# Patient Record
Sex: Female | Born: 1990 | Race: White | Hispanic: Yes | Marital: Married | Smoking: Never smoker
Health system: Southern US, Community
[De-identification: ages and names within clinical notes are randomized; demographics above are authoritative.]

## PROBLEM LIST (undated history)

## (undated) DIAGNOSIS — K219 Gastro-esophageal reflux disease without esophagitis: Secondary | ICD-10-CM

## (undated) DIAGNOSIS — E119 Type 2 diabetes mellitus without complications: Secondary | ICD-10-CM

---

## 2020-09-18 ENCOUNTER — Other Ambulatory Visit: Payer: Self-pay

## 2020-09-18 ENCOUNTER — Ambulatory Visit: Payer: Medicaid Other | Admitting: Gerontology

## 2020-09-18 ENCOUNTER — Encounter: Payer: Self-pay | Admitting: Gerontology

## 2020-09-18 VITALS — BP 121/79 | HR 81 | Ht 64.0 in | Wt 203.9 lb

## 2020-09-18 DIAGNOSIS — Z794 Long term (current) use of insulin: Secondary | ICD-10-CM

## 2020-09-18 DIAGNOSIS — E119 Type 2 diabetes mellitus without complications: Secondary | ICD-10-CM

## 2020-09-18 DIAGNOSIS — D75839 Thrombocytosis, unspecified: Secondary | ICD-10-CM

## 2020-09-18 DIAGNOSIS — Z7689 Persons encountering health services in other specified circumstances: Secondary | ICD-10-CM

## 2020-09-18 DIAGNOSIS — E785 Hyperlipidemia, unspecified: Secondary | ICD-10-CM | POA: Insufficient documentation

## 2020-09-18 DIAGNOSIS — E1169 Type 2 diabetes mellitus with other specified complication: Secondary | ICD-10-CM | POA: Insufficient documentation

## 2020-09-18 MED ORDER — INSULIN LISPRO 100 UNIT/ML ~~LOC~~ SOLN: 12.0000 [IU] | Freq: Three times a day (TID) | SUBCUTANEOUS | 1 refills | Status: DC

## 2020-09-18 MED ORDER — INSULIN GLARGINE 100 UNIT/ML ~~LOC~~ SOLN: 35.0000 [IU] | Freq: Every day | SUBCUTANEOUS | 1 refills | Status: AC

## 2020-09-18 MED ORDER — METFORMIN HCL 1000 MG PO TABS: 1000.0000 mg | ORAL_TABLET | Freq: Two times a day (BID) | ORAL | 1 refills | Status: DC

## 2020-09-18 MED ORDER — LISINOPRIL 2.5 MG PO TABS: 2.5000 mg | ORAL_TABLET | Freq: Every day | ORAL | 1 refills | Status: AC

## 2020-09-18 NOTE — Patient Instructions (Signed)
High Cholesterol  High cholesterol is a condition in which the blood has high levels of a white, waxy, fat-like substance (cholesterol). The human body needs small amounts of cholesterol. The liver makes all the cholesterol that the body needs. Extra (excess) cholesterol comes from the food that we eat. Cholesterol is carried from the liver by the blood through the blood vessels. If you have high cholesterol, deposits (plaques) may build up on the walls of your blood vessels (arteries). Plaques make the arteries narrower and stiffer. Cholesterol plaques increase your risk for heart attack and stroke. Work with your health care provider to keep your cholesterol levels in a healthy range. What increases the risk? This condition is more likely to develop in people who:  Eat foods that are high in animal fat (saturated fat) or cholesterol.  Are overweight.  Are not getting enough exercise.  Have a family history of high cholesterol. What are the signs or symptoms? There are no symptoms of this condition. How is this diagnosed? This condition may be diagnosed from the results of a blood test.  If you are older than age 20, your health care provider may check your cholesterol every 4-6 years.  You may be checked more often if you already have high cholesterol or other risk factors for heart disease. The blood test for cholesterol measures:  "Bad" cholesterol (LDL cholesterol). This is the main type of cholesterol that causes heart disease. The desired level for LDL is less than 100.  "Good" cholesterol (HDL cholesterol). This type helps to protect against heart disease by cleaning the arteries and carrying the LDL away. The desired level for HDL is 60 or higher.  Triglycerides. These are fats that the body can store or burn for energy. The desired number for triglycerides is lower than 150.  Total cholesterol. This is a measure of the total amount of cholesterol in your blood, including LDL  cholesterol, HDL cholesterol, and triglycerides. A healthy number is less than 200. How is this treated? This condition is treated with diet changes, lifestyle changes, and medicines. Diet changes  This may include eating more whole grains, fruits, vegetables, nuts, and fish.  This may also include cutting back on red meat and foods that have a lot of added sugar. Lifestyle changes  Changes may include getting at least 40 minutes of aerobic exercise 3 times a week. Aerobic exercises include walking, biking, and swimming. Aerobic exercise along with a healthy diet can help you maintain a healthy weight.  Changes may also include quitting smoking. Medicines  Medicines are usually given if diet and lifestyle changes have failed to reduce your cholesterol to healthy levels.  Your health care provider may prescribe a statin medicine. Statin medicines have been shown to reduce cholesterol, which can reduce the risk of heart disease. Follow these instructions at home: Eating and drinking If told by your health care provider:  Eat chicken (without skin), fish, veal, shellfish, ground turkey breast, and round or loin cuts of red meat.  Do not eat fried foods or fatty meats, such as hot dogs and salami.  Eat plenty of fruits, such as apples.  Eat plenty of vegetables, such as broccoli, potatoes, and carrots.  Eat beans, peas, and lentils.  Eat grains such as barley, rice, couscous, and bulgur wheat.  Eat pasta without cream sauces.  Use skim or nonfat milk, and eat low-fat or nonfat yogurt and cheeses.  Do not eat or drink whole milk, cream, ice cream, egg yolks,   or hard cheeses.  Do not eat stick margarine or tub margarines that contain trans fats (also called partially hydrogenated oils).  Do not eat saturated tropical oils, such as coconut oil and palm oil.  Do not eat cakes, cookies, crackers, or other baked goods that contain trans fats.  General instructions  Exercise as  directed by your health care provider. Increase your activity level with activities such as gardening, walking, and taking the stairs.  Take over-the-counter and prescription medicines only as told by your health care provider.  Do not use any products that contain nicotine or tobacco, such as cigarettes and e-cigarettes. If you need help quitting, ask your health care provider.  Keep all follow-up visits as told by your health care provider. This is important. Contact a health care provider if:  You are struggling to maintain a healthy diet or weight.  You need help to start on an exercise program.  You need help to stop smoking. Get help right away if:  You have chest pain.  You have trouble breathing. This information is not intended to replace advice given to you by your health care provider. Make sure you discuss any questions you have with your health care provider. Document Revised: 09/03/2017 Document Reviewed: 02/29/2016 Elsevier Patient Education  2020 Elsevier Inc. Diabetes Basics  Diabetes (diabetes mellitus) is a long-term (chronic) disease. It occurs when the body does not properly use sugar (glucose) that is released from food after you eat. Diabetes may be caused by one or both of these problems:  Your pancreas does not make enough of a hormone called insulin.  Your body does not react in a normal way to insulin that it makes. Insulin lets sugars (glucose) go into cells in your body. This gives you energy. If you have diabetes, sugars cannot get into cells. This causes high blood sugar (hyperglycemia). Follow these instructions at home: How is diabetes treated? You may need to take insulin or other diabetes medicines daily to keep your blood sugar in balance. Take your diabetes medicines every day as told by your doctor. List your diabetes medicines here: Diabetes medicines  Name of medicine: ______________________________ ? Amount (dose): _______________ Time  (a.m./p.m.): _______________ Notes: ___________________________________  Name of medicine: ______________________________ ? Amount (dose): _______________ Time (a.m./p.m.): _______________ Notes: ___________________________________  Name of medicine: ______________________________ ? Amount (dose): _______________ Time (a.m./p.m.): _______________ Notes: ___________________________________ If you use insulin, you will learn how to give yourself insulin by injection. You may need to adjust the amount based on the food that you eat. List the types of insulin you use here: Insulin  Insulin type: ______________________________ ? Amount (dose): _______________ Time (a.m./p.m.): _______________ Notes: ___________________________________  Insulin type: ______________________________ ? Amount (dose): _______________ Time (a.m./p.m.): _______________ Notes: ___________________________________  Insulin type: ______________________________ ? Amount (dose): _______________ Time (a.m./p.m.): _______________ Notes: ___________________________________  Insulin type: ______________________________ ? Amount (dose): _______________ Time (a.m./p.m.): _______________ Notes: ___________________________________  Insulin type: ______________________________ ? Amount (dose): _______________ Time (a.m./p.m.): _______________ Notes: ___________________________________ How do I manage my blood sugar?  Check your blood sugar levels using a blood glucose monitor as directed by your doctor. Your doctor will set treatment goals for you. Generally, you should have these blood sugar levels:  Before meals (preprandial): 80-130 mg/dL (4.4-7.2 mmol/L).  After meals (postprandial): below 180 mg/dL (10 mmol/L).  A1c level: less than 7%. Write down the times that you will check your blood sugar levels: Blood sugar checks  Time: _______________ Notes: ___________________________________  Time: _______________ Notes:  ___________________________________  Time:   _______________ Notes: ___________________________________  Time: _______________ Notes: ___________________________________  Time: _______________ Notes: ___________________________________  Time: _______________ Notes: ___________________________________  What do I need to know about low blood sugar? Low blood sugar is called hypoglycemia. This is when blood sugar is at or below 70 mg/dL (3.9 mmol/L). Symptoms may include:  Feeling: ? Hungry. ? Worried or nervous (anxious). ? Sweaty and clammy. ? Confused. ? Dizzy. ? Sleepy. ? Sick to your stomach (nauseous).  Having: ? A fast heartbeat. ? A headache. ? A change in your vision. ? Tingling or no feeling (numbness) around the mouth, lips, or tongue. ? Jerky movements that you cannot control (seizure).  Having trouble with: ? Moving (coordination). ? Sleeping. ? Passing out (fainting). ? Getting upset easily (irritability). Treating low blood sugar To treat low blood sugar, eat or drink something sugary right away. If you can think clearly and swallow safely, follow the 15:15 rule:  Take 15 grams of a fast-acting carb (carbohydrate). Talk with your doctor about how much you should take.  Some fast-acting carbs are: ? Sugar tablets (glucose pills). Take 3-4 glucose pills. ? 6-8 pieces of hard candy. ? 4-6 oz (120-150 mL) of fruit juice. ? 4-6 oz (120-150 mL) of regular (not diet) soda. ? 1 Tbsp (15 mL) honey or sugar.  Check your blood sugar 15 minutes after you take the carb.  If your blood sugar is still at or below 70 mg/dL (3.9 mmol/L), take 15 grams of a carb again.  If your blood sugar does not go above 70 mg/dL (3.9 mmol/L) after 3 tries, get help right away.  After your blood sugar goes back to normal, eat a meal or a snack within 1 hour. Treating very low blood sugar If your blood sugar is at or below 54 mg/dL (3 mmol/L), you have very low blood sugar (severe  hypoglycemia). This is an emergency. Do not wait to see if the symptoms will go away. Get medical help right away. Call your local emergency services (911 in the [place] Do not drive yourself to the hospital. Questions to ask your health care provider  Do I need to meet with a diabetes educator?  What equipment will I need to care for myself at home?  What diabetes medicines do I need? When should I take them?  How often do I need to check my blood sugar?  What number can I call if I have questions?  When is my next doctor's visit?  Where can I find a support group for people with diabetes? Where to find more information  American Diabetes Association: www.diabetes.org  American Association of Diabetes Educators: www.diabeteseducator.org/patient-resources Contact a doctor if:  Your blood sugar is at or above 240 mg/dL (13.3 mmol/L) for 2 days in a row.  You have been sick or have had a fever for 2 days or more, and you are not getting better.  You have any of these problems for more than 6 hours: ? You cannot eat or drink. ? You feel sick to your stomach (nauseous). ? You throw up (vomit). ? You have watery poop (diarrhea). Get help right away if:  Your blood sugar is lower than 54 mg/dL (3 mmol/L).  You get confused.  You have trouble: ? Thinking clearly. ? Breathing. Summary  Diabetes (diabetes mellitus) is a long-term (chronic) disease. It occurs when the body does not properly use sugar (glucose) that is released from food after digestion.  Take insulin and diabetes medicines as told.  Check   your blood sugar every day, as often as told.  Keep all follow-up visits as told by your doctor. This is important. This information is not intended to replace advice given to you by your health care provider. Make sure you discuss any questions you have with your health care provider. Document Revised: 05/24/2019 Document Reviewed: 12/03/2017 Elsevier Patient Education   2020 Elsevier Inc.  

## 2020-09-18 NOTE — Progress Notes (Signed)
[facility]   Progress Note: General Provider: Regino Bellow, NP  SUBJECTIVE:   Vanessa Bush is a 30 y.o. female who has a history of type 2 diabetes with long term use of insulin, hyperlipidemia, Insomnia, proteinuria, and elevated alkaline phosphatase level. The patient presents today to establish care with this clinic. She states that she was diagnosed with type II diabetes at age 12, and she has been on insulin since 2019. She takes basaglar 35 units at night and Humalog 12 units before meal 3 x a day. She checks her blood glucose three times a day, and her average fasting blood glucose is between 100-150 mg/dl, prandial 413-244 mg/dl. Her lowest blood glucose was 67 mg/dl in November 2021, due to not eating enough prior to taking insulin. She reports nighttime polydipsia 3-4 times a week, but denies polyphagia, polyuria or peripheral neuropathy. She states that she does regular foot check and follows low carb/a low concentrated sweets diet.  She saw an ophthalmologist around June 2021. She reports taking both doses of  Moderna vaccines. She denies smoking or use of alcohol. She adheres to medication regimens.  Platelets was 529 on 09/18/2020, patient has not reported chest pain, visual disturbances, or headache. on 09/18/2020 Microalb/creat ratio was 1,009 mg/g  Overall she is doing well and offers no further complaints.  Review of Systems  Constitutional: Negative.   HENT: Negative.   Eyes: Negative.   Respiratory: Negative.   Cardiovascular: Negative.   Gastrointestinal: Negative.   Genitourinary: Negative.   Musculoskeletal: Negative.   Skin: Negative.   Neurological: Negative.   Endo/Heme/Allergies: Positive for polydipsia.  Psychiatric/Behavioral: Negative.      OBJECTIVE: BP 121/79 (BP Location: Left Arm, Patient Position: Sitting)   Pulse 81   Ht 5\' 4"  (1.626 m)   Wt 203 lb 14.4 oz (92.5 kg)   SpO2 98%   BMI 35.00 kg/m   Wt Readings from Last  3 Encounters:  09/18/20 203 lb 14.4 oz (92.5 kg)     Physical Exam Vitals reviewed.  HENT:     Head: Normocephalic.     Nose: Nose normal.     Mouth/Throat:     Mouth: Mucous membranes are moist.  Cardiovascular:     Rate and Rhythm: Normal rate.     Pulses: Normal pulses.     Heart sounds: Normal heart sounds.  Pulmonary:     Effort: Pulmonary effort is normal.  Abdominal:     General: Bowel sounds are normal.     Palpations: Abdomen is soft.  Musculoskeletal:        General: Normal range of motion.     Cervical back: Normal range of motion.  Skin:    General: Skin is warm and dry.     Capillary Refill: Capillary refill takes less than 2 seconds.  Neurological:     General: No focal deficit present.     Mental Status: She is alert and oriented to person, place, and time.  Psychiatric:        Mood and Affect: Mood normal.        Behavior: Behavior normal.        Thought Content: Thought content normal.        Judgment: Judgment normal.     ASSESSMENT/PLAN:  1.  type 2 diabetes mellitus without complication, with long-term current use of insulin (HCC) _Your HgbA1c goal should be less than 7% -Continue current treatments: insulin glargine (LANTUS) 35 units daily, insulin lispro (HUMALOG)  12 units. Three (3) times a day before meals. metFORMIN (GLUCOPHAGE) 1000 MG tablet Take 1 tablet (1,000 mg total) by mouth 2 (two) times a day with meals Advise to check blood glucose fasting, before administering insulin, and when having low or high blood glucose symptoms and to bring blood glucose log to the next appointment. Fasting blood glucose should be between 80 mg/dl to 130mg /dl -Advise to continue on low carb/a low concentrated sweets diet and exercise as tolerated. Discussed the importance of rotating insulin injection sites and regular diabetes foot care guidelines. -  HgB A1c - Urine Microalbumin w/creat. ratio - B12 - Glutamic acid decarboxylase auto abs - C-peptide -may  consider adding Ozempic since recent HgbA1c is 8.1%  2. Hyperlipidemia associated with type 2 diabetes mellitus (HCC) No medication changes warranted at the present time. -atorvastatin (LIPITOR) 20 MG tablet Take 1 tablet (20 mg total) by mouth daily  -Cholesterol, Total - Advise DASH diet and daily exercise as tolerated. 3. Encounter to establish care - CBC - Comp Met (CMET) -TSH  Thrombocytosis, unspecified - CBC -Reviewed lab results of 09/18/2020 after this visit. Platelets 529, Hgba1c 8.1%, and microalb/creat ration1,009 mg/g, will repeat CBC, and microalb/creat. The clinic contacted the patient to return for repeat lab works and seek medical attention if experiencing chest pain, shortness of breath, headaches, or visual disturbances. Patient to return to the clinic to pick up [facility] charity care application. If the repeat microalb/creat ratio is still elevated, consult nephrology. Return in one week after the repeat lab draw instead of in two weeks The patient was given clear instructions to go to ER or return to medical center if symptoms do not improve, worsen or new problems develop. The patient verbalized understanding and agreed with plan of care.  Vanessa Bush, AGNP [facility]

## 2020-09-19 LAB — CBC
Hematocrit: 36.9 % (ref 34.0–46.6)
Hemoglobin: 12 g/dL (ref 11.1–15.9)
MCH: 26.1 pg — ABNORMAL LOW (ref 26.6–33.0)
MCHC: 32.5 g/dL (ref 31.5–35.7)
MCV: 80 fL (ref 79–97)
Platelets: 529 10*3/uL — ABNORMAL HIGH (ref 150–450)
RBC: 4.6 x10E6/uL (ref 3.77–5.28)
RDW: 13.9 % (ref 11.7–15.4)
WBC: 10.6 10*3/uL (ref 3.4–10.8)

## 2020-09-19 LAB — COMPREHENSIVE METABOLIC PANEL
ALT: 36 IU/L — ABNORMAL HIGH (ref 0–32)
AST: 25 IU/L (ref 0–40)
Albumin/Globulin Ratio: 1.2 (ref 1.2–2.2)
Albumin: 4 g/dL (ref 3.9–5.0)
Alkaline Phosphatase: 113 IU/L (ref 44–121)
BUN/Creatinine Ratio: 15 (ref 9–23)
BUN: 10 mg/dL (ref 6–20)
Bilirubin Total: 0.2 mg/dL (ref 0.0–1.2)
CO2: 21 mmol/L (ref 20–29)
Calcium: 9.4 mg/dL (ref 8.7–10.2)
Chloride: 99 mmol/L (ref 96–106)
Creatinine, Ser: 0.65 mg/dL (ref 0.57–1.00)
GFR calc Af Amer: 139 mL/min/{1.73_m2} (ref >59)
GFR calc non Af Amer: 120 mL/min/{1.73_m2} (ref >59)
Globulin, Total: 3.4 g/dL (ref 1.5–4.5)
Glucose: 188 mg/dL — ABNORMAL HIGH (ref 65–99)
Potassium: 4.3 mmol/L (ref 3.5–5.2)
Sodium: 140 mmol/L (ref 134–144)
Total Protein: 7.4 g/dL (ref 6.0–8.5)

## 2020-09-19 LAB — C-PEPTIDE: C-Peptide: 3.7 ng/mL (ref 1.1–4.4)

## 2020-09-19 LAB — MICROALBUMIN / CREATININE URINE RATIO
Creatinine, Urine: 74 mg/dL
Microalb/Creat Ratio: 1009 mg/g creat — ABNORMAL HIGH (ref 0–29)
Microalbumin, Urine: 746.7 ug/mL

## 2020-09-19 LAB — HEMOGLOBIN A1C
Est. average glucose Bld gHb Est-mCnc: 186 mg/dL
Hgb A1c MFr Bld: 8.1 % — ABNORMAL HIGH (ref 4.8–5.6)

## 2020-09-19 LAB — VITAMIN B12: Vitamin B-12: 662 pg/mL (ref 232–1245)

## 2020-09-19 LAB — GLUTAMIC ACID DECARBOXYLASE AUTO ABS: Glutamic Acid Decarb Ab: <5 U/mL (ref 0.0–5.0)

## 2020-09-19 LAB — CHOLESTEROL, TOTAL: Cholesterol, Total: 174 mg/dL (ref 100–199)

## 2020-09-19 LAB — TSH: TSH: 1.71 u[IU]/mL (ref 0.450–4.500)

## 2020-09-25 ENCOUNTER — Other Ambulatory Visit: Payer: Medicaid Other

## 2020-09-25 ENCOUNTER — Other Ambulatory Visit: Payer: Self-pay

## 2020-09-25 VITALS — BP 105/68 | HR 64 | Temp 97.6°F | Ht 64.0 in | Wt 205.0 lb

## 2020-09-25 DIAGNOSIS — E119 Type 2 diabetes mellitus without complications: Secondary | ICD-10-CM

## 2020-09-25 NOTE — Progress Notes (Unsigned)
micro

## 2020-09-26 LAB — CBC WITH DIFFERENTIAL/PLATELET
Basophils Absolute: 0 10*3/uL (ref 0.0–0.2)
Basos: 0 %
EOS (ABSOLUTE): 0.2 10*3/uL (ref 0.0–0.4)
Eos: 3 %
Hematocrit: 35.9 % (ref 34.0–46.6)
Hemoglobin: 11.6 g/dL (ref 11.1–15.9)
Immature Grans (Abs): 0 10*3/uL (ref 0.0–0.1)
Immature Granulocytes: 0 %
Lymphocytes Absolute: 2.4 10*3/uL (ref 0.7–3.1)
Lymphs: 29 %
MCH: 26.3 pg — ABNORMAL LOW (ref 26.6–33.0)
MCHC: 32.3 g/dL (ref 31.5–35.7)
MCV: 81 fL (ref 79–97)
Monocytes Absolute: 0.8 10*3/uL (ref 0.1–0.9)
Monocytes: 10 %
Neutrophils Absolute: 4.6 10*3/uL (ref 1.4–7.0)
Neutrophils: 58 %
Platelets: 400 10*3/uL (ref 150–450)
RBC: 4.41 x10E6/uL (ref 3.77–5.28)
RDW: 13.7 % (ref 11.7–15.4)
WBC: 8.1 10*3/uL (ref 3.4–10.8)

## 2020-09-26 LAB — MICROALBUMIN / CREATININE URINE RATIO
Creatinine, Urine: 94.9 mg/dL
Microalb/Creat Ratio: 933 mg/g creat — ABNORMAL HIGH (ref 0–29)
Microalbumin, Urine: 885.6 ug/mL

## 2020-09-27 ENCOUNTER — Other Ambulatory Visit: Payer: Self-pay

## 2020-09-27 ENCOUNTER — Ambulatory Visit: Payer: Medicaid Other | Admitting: Pharmacy Technician

## 2020-09-27 DIAGNOSIS — Z79899 Other long term (current) drug therapy: Secondary | ICD-10-CM

## 2020-09-27 NOTE — Progress Notes (Addendum)
Completed [facility] application and contract.  Patient agreed to all terms of the [facility] contract.    Patient to provide 1st page of 2020 Vanessa Bush Tax Return and last 30 days of bank statements from spouse.  Also needs to provide 2022 financial information and 2021 Federal Tax Return when available.    Provided patient with community resource material based on her particular needs.    Lantus Prescription Application completed with patient.  Forwarded to [facility] for signature.  Upon receipt of signed application from provider and proof of income from patient, Lantus Prescription Application will be submitted to [employer].  Sherilyn Dacosta Care Manager [facility]

## 2020-10-02 ENCOUNTER — Telehealth: Payer: Medicaid Other | Admitting: Adult Health

## 2020-10-09 ENCOUNTER — Other Ambulatory Visit: Payer: Medicaid Other

## 2020-10-09 NOTE — Progress Notes (Deleted)
  [facility] Visit Note  Patient: Vanessa Bush MRN: [id number] Date of Birth: 01/22/1991 PCP: No primary care provider on file.   Vanessa Bush 30 y.o. female presents for a telephone visit for medication management today. Verified patient with two identifiers.   There were no vitals taken for this visit.  Patient Information  No past medical history on file.   No past surgical history on file.   Family History  Problem Relation Age of Onset  . Diabetes Mother   . Diabetes Father   . Diabetes Sister     New Diagnoses (since last visit):   Family Support: {Family Support:210800003}  Lifestyle Diet: Breakfast:*** Lunch:*** Dinner:*** Drinks:***            Social History   Substance and Sexual Activity  Alcohol Use None      Social History   Tobacco Use  Smoking Status Never Smoker  Smokeless Tobacco Never Used      Health Maintenance  Topic Date Due  . Hepatitis C Screening  Never done  . PNEUMOCOCCAL POLYSACCHARIDE VACCINE AGE 63-64 HIGH RISK  Never done  . FOOT EXAM  Never done  . OPHTHALMOLOGY EXAM  Never done  . HIV Screening  Never done  . TETANUS/TDAP  Never done  . PAP-Cervical Cytology Screening  Never done  . PAP SMEAR-Modifier  Never done  . COVID-19 Vaccine (3 - Booster for Moderna series) 01/07/2021  . HEMOGLOBIN A1C  03/18/2021  . INFLUENZA VACCINE  Completed   Health Maintenance/Date Completed  Last ED visit: *** Last Visit to PCP: 09/18/2020 Next Visit to PCP: *** Specialist Visit: *** Dental Exam: *** Eye Exam: *** Pelvic/PAP Exam: *** Mammogram: *** Colonoscopy: *** Flu Vaccine: *** Pneumonia Vaccine: *** COVID-19 Vaccine: ***   Assessment and Plan: Diabetes 09/18/2020 A1c 8.1%; goal <7%  HTN Unable to obtain vitals due to telephone visit (BP from last appt 09/18/2020 121/79 - controlled)  HLD 09/18/2020 TC 174 WNL  Access/Adherence   Vanessa Bush, PharmD Pharmacy Resident   10/09/2020 8:30 AM

## 2020-10-16 ENCOUNTER — Telehealth: Payer: Self-pay | Admitting: Gerontology

## 2020-10-16 NOTE — Telephone Encounter (Signed)
lmom to confirm appt on 2/15 at 12:00 pm

## 2020-10-29 ENCOUNTER — Telehealth: Payer: Self-pay | Admitting: Gerontology

## 2020-10-29 ENCOUNTER — Ambulatory Visit: Payer: Medicaid Other | Admitting: Gerontology

## 2020-10-29 NOTE — Telephone Encounter (Signed)
Pt was busy and said they would call back.

## 2020-11-04 ENCOUNTER — Telehealth: Payer: Self-pay | Admitting: Pharmacy Technician

## 2020-11-04 NOTE — Telephone Encounter (Signed)
Patient failed to provide 1st page of 2020 Ger[employer]ine Girt Tax Return and 2022 proof of income.  No additional medication assistance will be provided by [facility] without the required proof of income documentation.  Patient notified by letter.  Sherilyn Dacosta Care Manager [facility]   [address] [place], [place]  [address]  November 04, 2020    [facility] [address] [place], [place]  [address]  Dear Vanessa Bush:  This is to inform you that you are no longer eligible to receive medication assistance at [facility].  The reason(s) are:    _____Your total gross monthly household income exceeds 250% of the Federal Poverty Level.   _____Tangible assets (savings, checking, stocks/bonds, pension, retirement, etc.) exceeds our limit  _____You are eligible to receive benefits from [employer], [facility] or HIV Medication              Assistance Program _____You are eligible to receive benefits from a Medicare Part "D" plan _____You have prescription insurance  _____You are not an [facility] resident __X__Failure to provide all requested proof of income information for 2022.  Still need February 2022 paystubs and bank statement from [place].  Also, need 1st page of 2020 [employer] Tax Return.  Will need 2021 Federal Tax Return by 01/12/21.   Medication assistance will resume once all requested financial information has been returned to our clinic.  If you have questions, please contact our clinic at [phone].    Thank you,  [facility]

## 2020-11-05 ENCOUNTER — Telehealth: Payer: Self-pay

## 2020-11-05 NOTE — Telephone Encounter (Signed)
Called to schedule f/u appt. Pt stated she will no longer need care from [facility] - is continuing care with another clinic.

## 2021-05-05 ENCOUNTER — Other Ambulatory Visit: Payer: Self-pay

## 2021-05-05 ENCOUNTER — Emergency Department: Payer: Medicaid Other

## 2021-05-05 ENCOUNTER — Emergency Department
Admission: EM | Admit: 2021-05-05 | Discharge: 2021-05-05 | Disposition: A | Discharge status: Home / Self Care | Payer: Medicaid Other | Attending: Emergency Medicine | Admitting: Emergency Medicine

## 2021-05-05 DIAGNOSIS — S93601A Unspecified sprain of right foot, initial encounter: Secondary | ICD-10-CM | POA: Insufficient documentation

## 2021-05-05 DIAGNOSIS — Z794 Long term (current) use of insulin: Secondary | ICD-10-CM | POA: Insufficient documentation

## 2021-05-05 DIAGNOSIS — W108XXA Fall (on) (from) other stairs and steps, initial encounter: Secondary | ICD-10-CM | POA: Insufficient documentation

## 2021-05-05 DIAGNOSIS — Z7984 Long term (current) use of oral hypoglycemic drugs: Secondary | ICD-10-CM | POA: Insufficient documentation

## 2021-05-05 DIAGNOSIS — E119 Type 2 diabetes mellitus without complications: Secondary | ICD-10-CM | POA: Insufficient documentation

## 2021-05-05 HISTORY — DX: Type 2 diabetes mellitus without complications: E11.9

## 2021-05-05 IMAGING — CR DG ANKLE COMPLETE 3+V*R*
1 series · 3 of 3 positions shown · non-contrast
Comparison: None.

CLINICAL DATA: Status post fall.  Pain and swelling.

EXAM:
RIGHT ANKLE - COMPLETE 3+ VIEW

[Series 1: dg ankle complete right · 0.14mm/px · 3 of 3 slices shown]
[im 1/3]
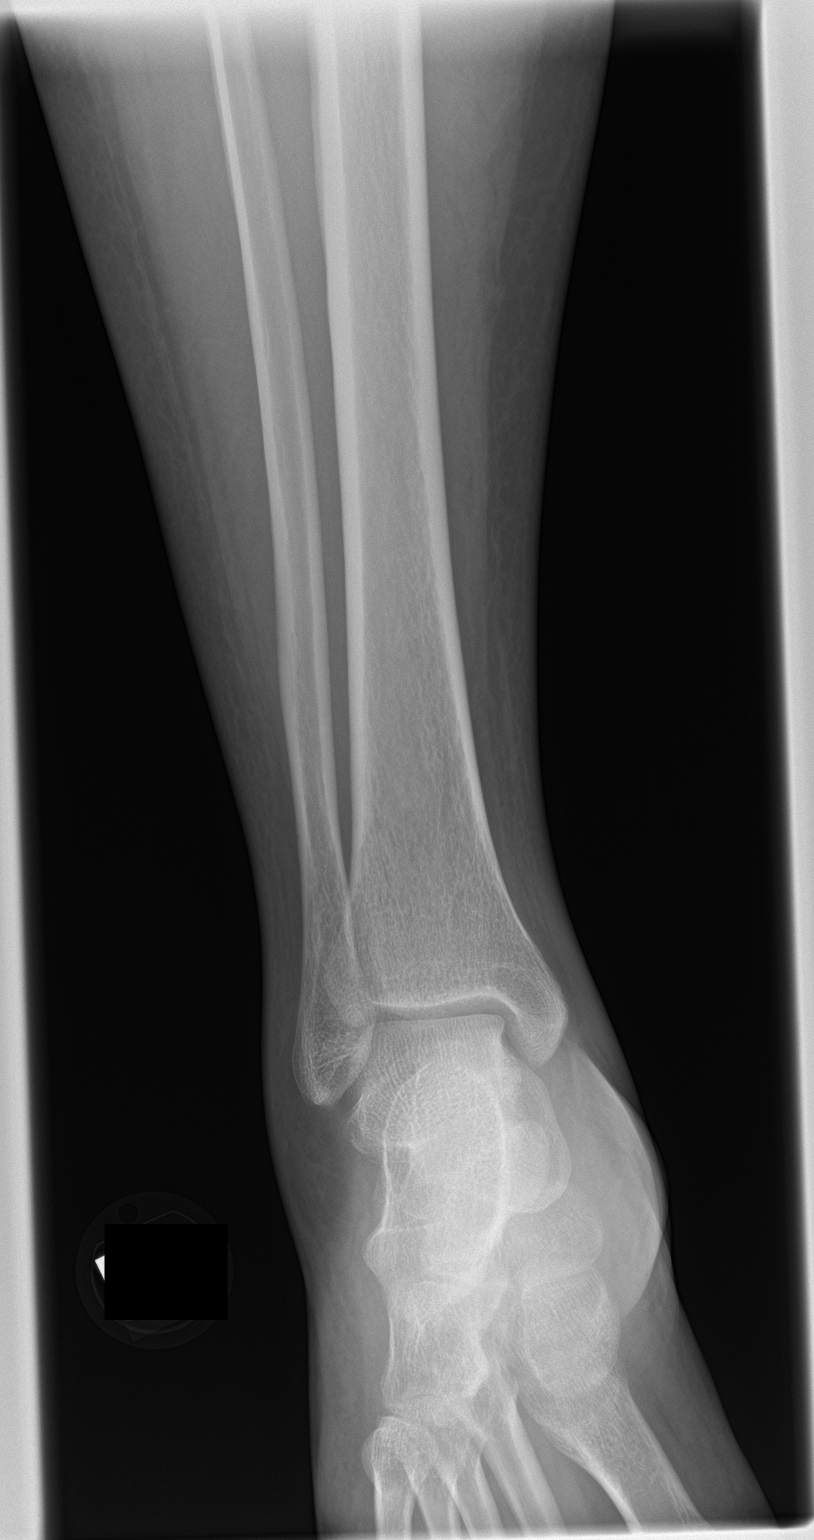
[im 2/3]
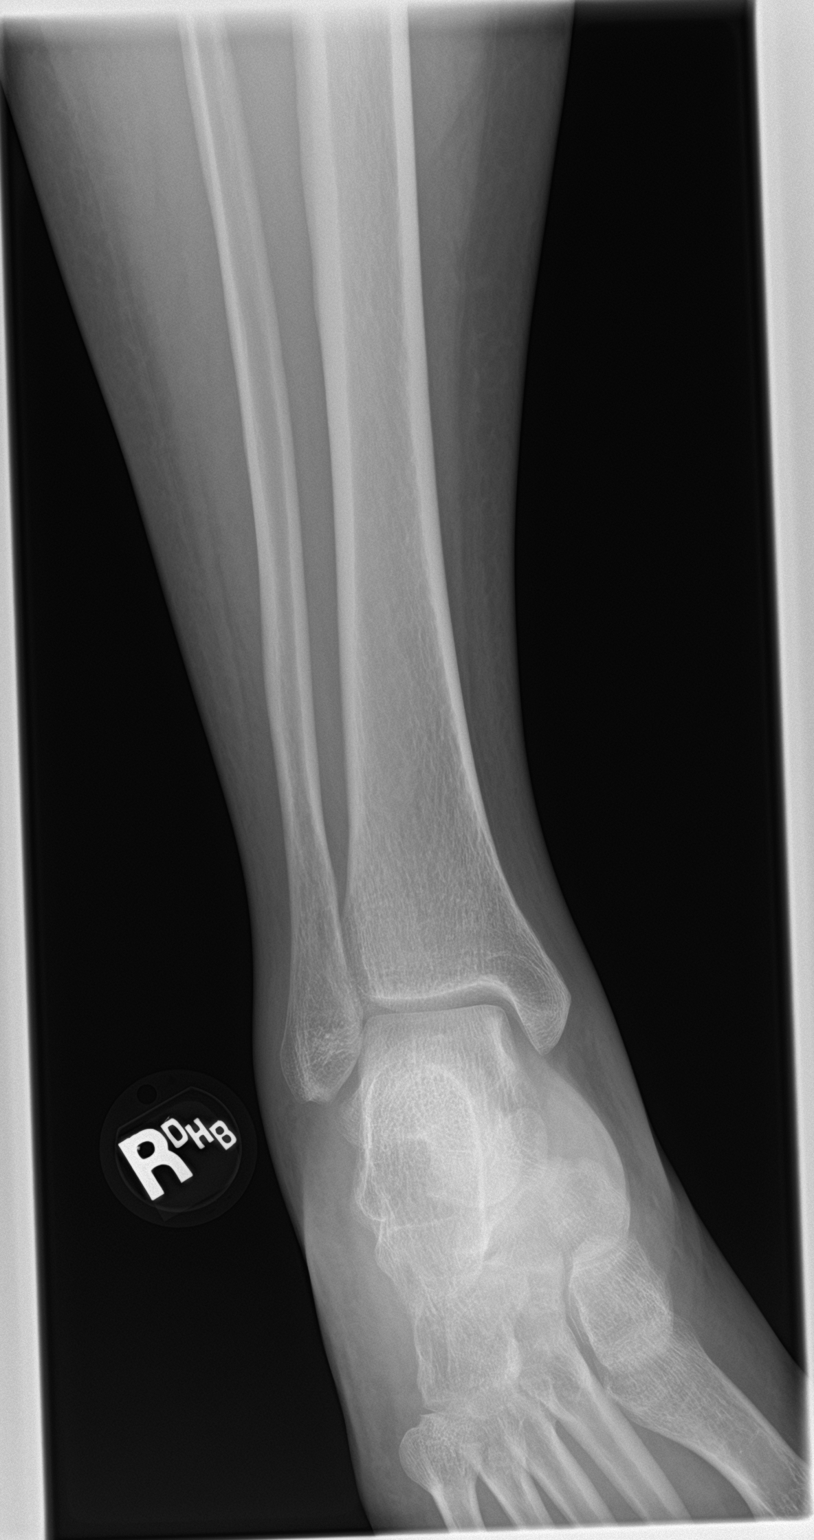
[im 3/3]
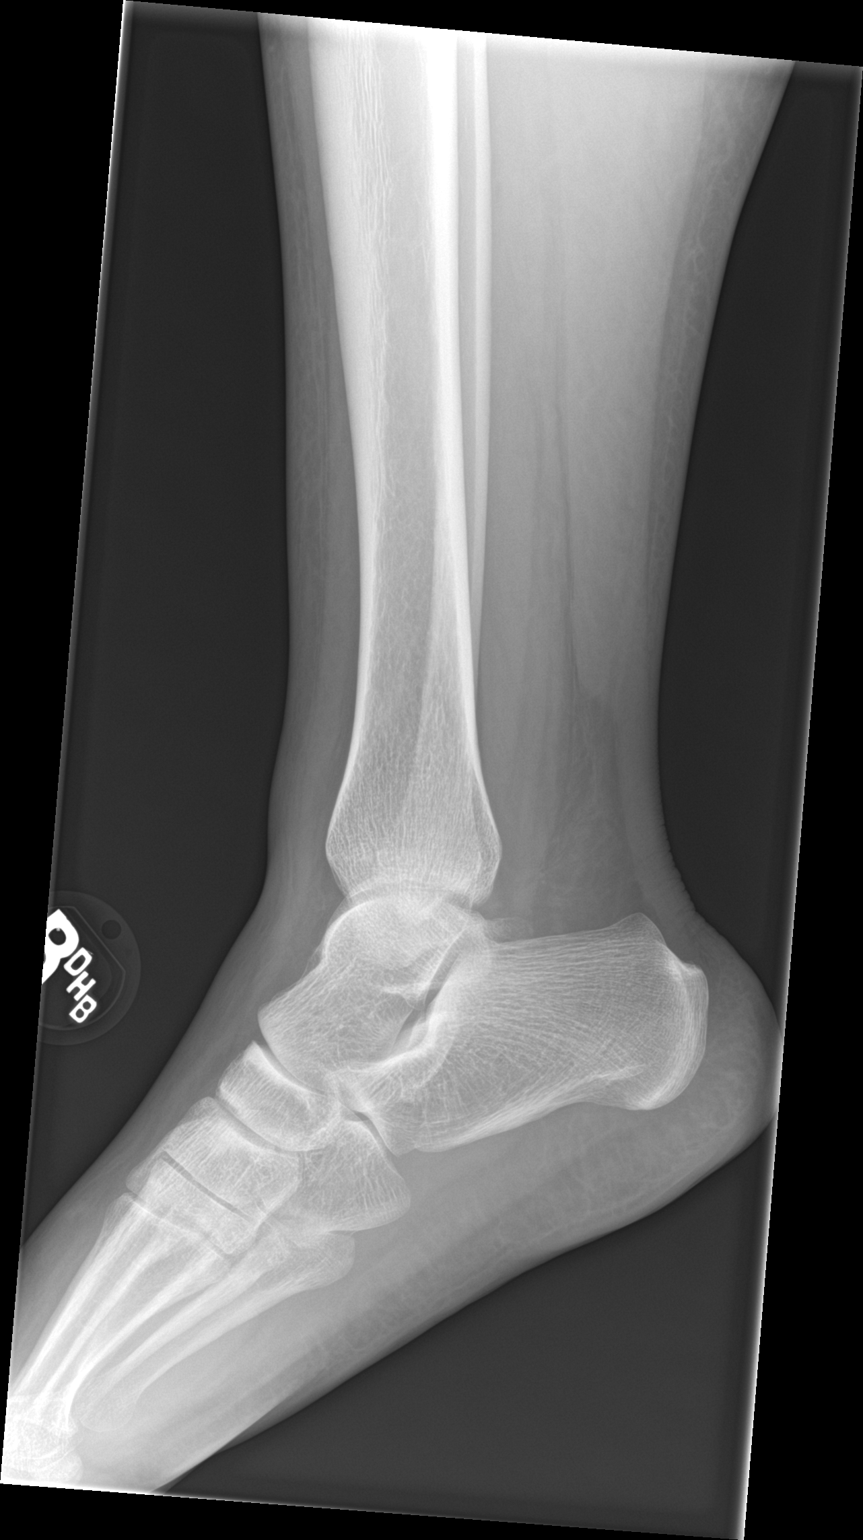

[3 of 3 positions shown; findings below may reference images not displayed]

FINDINGS: Mild diffuse soft tissue swelling. No acute fracture or dislocation.
No significant arthropathy.
IMPRESSION: 1. Soft tissue swelling.
2. No acute bone abnormality.

## 2021-05-05 IMAGING — CR DG FOOT COMPLETE 3+V*R*
1 series · 3 of 3 positions shown · non-contrast
Comparison: None.

CLINICAL DATA: Fall.

EXAM:
RIGHT FOOT COMPLETE - 3+ VIEW

[Series 1: dg foot complete right · 0.14mm/px · 3 of 3 slices shown]
[im 1/3]
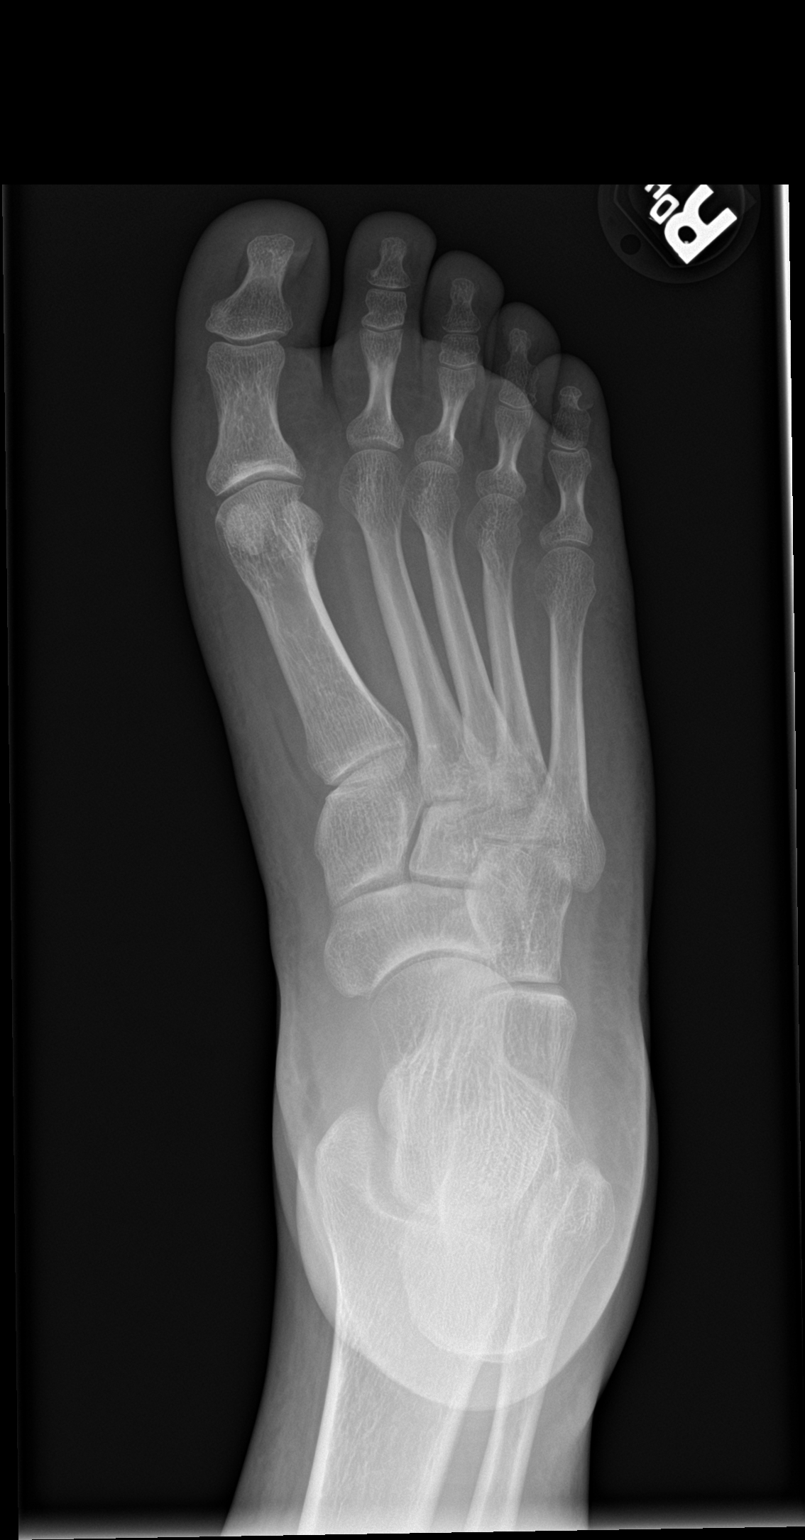
[im 2/3]
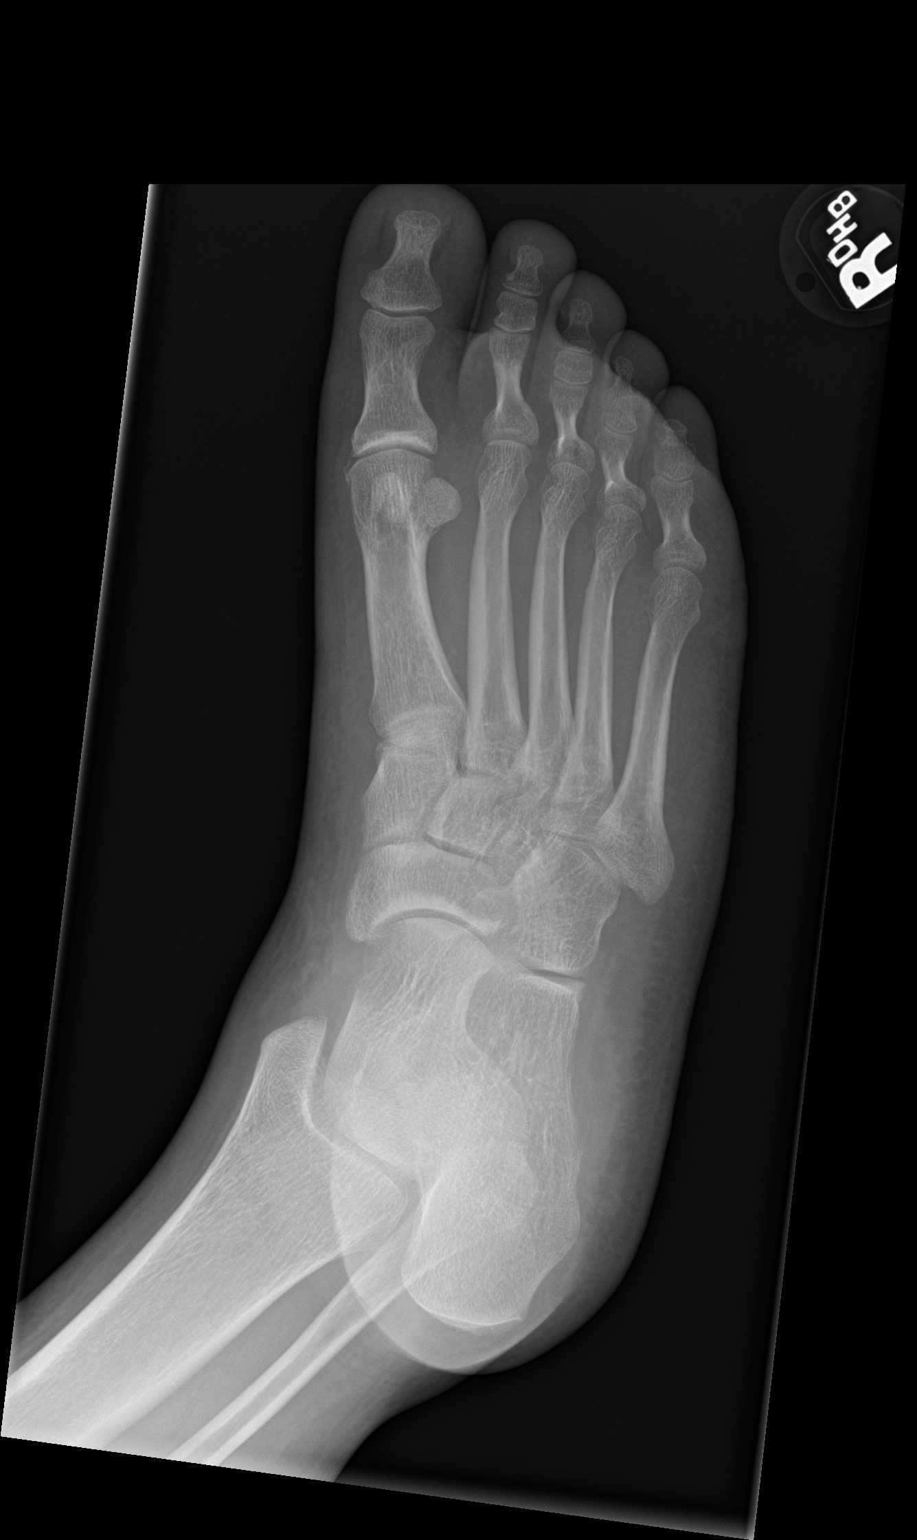
[im 3/3]
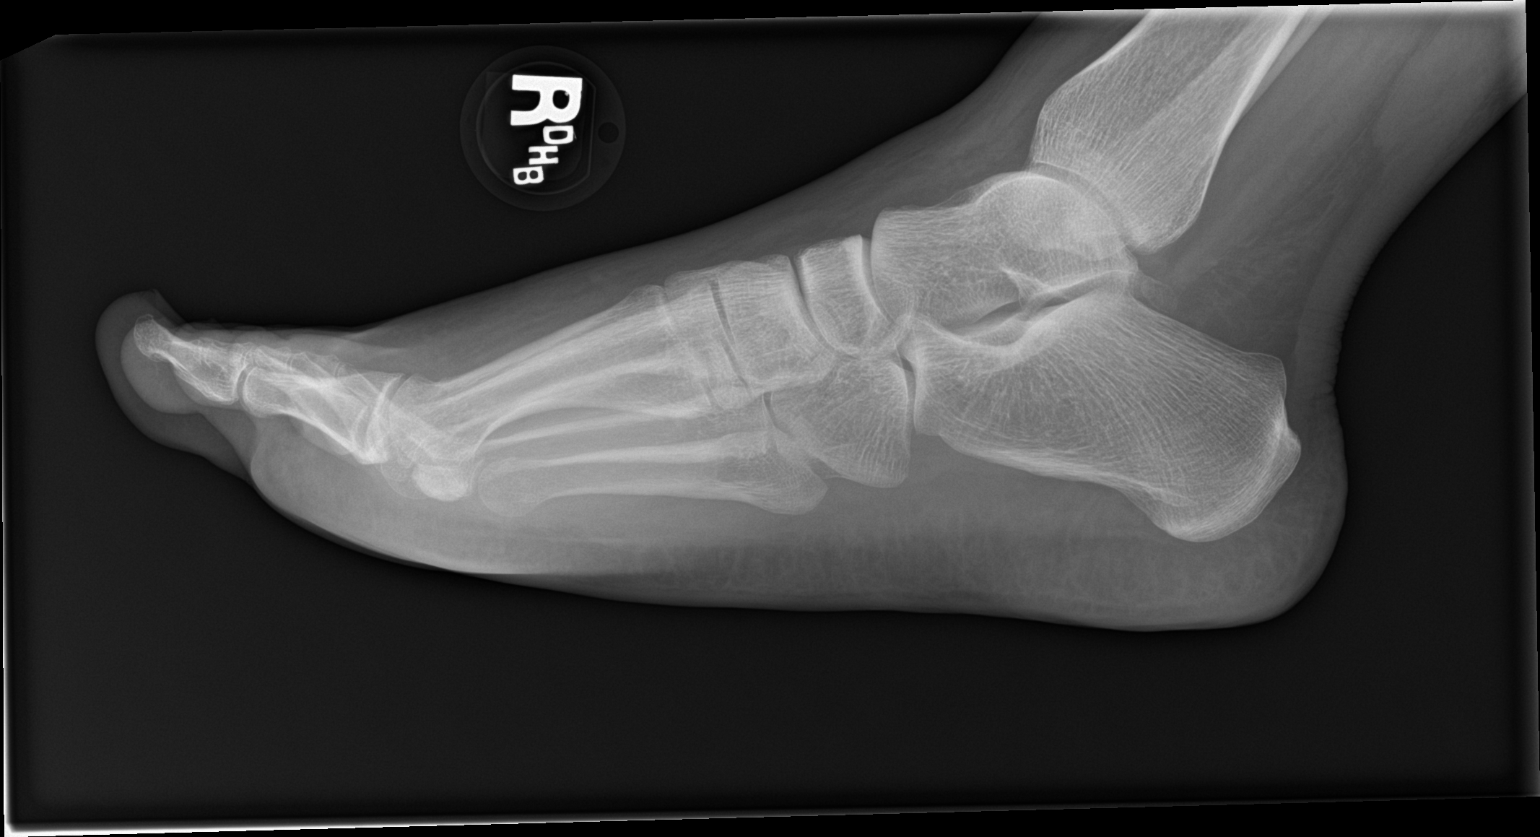

[3 of 3 positions shown; findings below may reference images not displayed]

FINDINGS: There is no evidence of fracture or dislocation. There is no
evidence of arthropathy or other focal bone abnormality. Soft
tissues are unremarkable.
IMPRESSION: Negative.

## 2021-05-05 MED ORDER — MELOXICAM 7.5 MG PO TABS
15.0000 mg | ORAL_TABLET | Freq: Once | ORAL | Status: AC
  Administered 2021-05-05: 15 mg via ORAL
  Filled 2021-05-05: qty 2

## 2021-05-05 MED ORDER — MELOXICAM 15 MG PO TABS: 15.0000 mg | ORAL_TABLET | Freq: Every day | ORAL | 0 refills | Status: DC

## 2021-05-05 NOTE — ED Provider Notes (Signed)
[facility] Emergency Department Provider Note  ____________________________________________  Time seen: Approximately 8:12 PM  I have reviewed the triage vital signs and the nursing notes.   HISTORY  Chief Complaint Foot Injury    HPI Vanessa Bush is a 30 y.o. female who presents the emergency department complaining of mid foot pain.  Patient states that she missed a stair a couple days ago and ended up injuring her foot.  She states that the pain is mostly in the midfoot.  She still ambulatory on the foot at this time.  She has had some edema to the foot but denies any pain or edema to the ankle or calf.  Patient has been taking over-the-counter medications with no significant relief.  No other reported injury at this time.       Past Medical History:  Diagnosis Date   Diabetes mellitus without complication [facility])     Patient Active Problem List   Diagnosis Date Noted   Type II diabetes with long term use of insulin (HCC) 09/18/2020   Hyperlipidemia associated with type 2 diabetes mellitus (HCC) 09/18/2020    History reviewed. No pertinent surgical history.  Prior to Admission medications   Medication Sig Start Date End Date Taking? Authorizing Provider  meloxicam (MOBIC) 15 MG tablet Take 1 tablet (15 mg total) by mouth daily. 05/05/21  Yes Taras Rask, Delorise Royals, PA-C  BD VEO INSULIN SYRINGE U/F 31G X 15/64" 0.5 ML MISC USE AS DIRECTED WITH INSULIN 09/18/20 09/18/21    COMFORT EZ PEN NEEDLES 32G X 4 MM MISC USE WITH INSULIN 09/18/20 09/18/21    insulin glargine (LANTUS) 100 UNIT/ML injection Inject 0.35 mLs (35 Units total) into the skin daily. 09/18/20   Abate, Desta A, NP  insulin glargine (LANTUS) 100 UNIT/ML Solostar Pen INJECT 35 UNITS INTO THE SKIN EVERY DAY 09/18/20 03/13/21  Abate, Desta A, NP  insulin lispro (HUMALOG) 100 UNIT/ML injection INJECT 12 UNITS INTO THE SKIN 3 TIMES A DAY BEFORE MEALS 09/18/20 09/18/21  Abate, Desta A, NP  lisinopril  (ZESTRIL) 2.5 MG tablet Take 1 tablet (2.5 mg total) by mouth daily. 09/18/20   Abate, Desta A, NP  lisinopril (ZESTRIL) 5 MG tablet TAKE 1/2 TABLET BY MOUTH EVERY DAY 09/18/20 09/18/21  Abate, Desta A, NP  metFORMIN (GLUCOPHAGE) 1000 MG tablet TAKE ONE TABLET BY MOUTH 2 TIMES A DAY WITH MEALS 09/18/20 09/18/21  Abate, Desta A, NP    Allergies Patient has no known allergies.  Family History  Problem Relation Age of Onset   Diabetes Mother    Diabetes Father    Diabetes Sister     Social History Social History   Tobacco Use   Smoking status: Never   Smokeless tobacco: Never  Substance Use Topics   Alcohol use: Not Currently   Drug use: Never     Review of Systems  Constitutional: No fever/chills Eyes: No visual changes. No discharge ENT: No upper respiratory complaints. Cardiovascular: no chest pain. Respiratory: no cough. No SOB. Gastrointestinal: No abdominal pain.  No nausea, no vomiting.  No diarrhea.  No constipation. Musculoskeletal: Right foot pain/injury Skin: Negative for rash, abrasions, lacerations, ecchymosis. Neurological: Negative for headaches, focal weakness or numbness.  10 System ROS otherwise negative.  ____________________________________________   PHYSICAL EXAM:  VITAL SIGNS: ED Triage Vitals  Enc Vitals Group     BP 05/05/21 1933 (!) 134/92     Pulse Rate 05/05/21 1933 87     Resp 05/05/21 1933 18  Temp 05/05/21 1933 98.7 F (37.1 C)     Temp Source 05/05/21 1933 Oral     SpO2 05/05/21 1933 100 %     Weight 05/05/21 1930 210 lb (95.3 kg)     Height 05/05/21 1930 5\' 4"  (1.626 m)     Head Circumference --      Peak Flow --      Pain Score 05/05/21 1929 6     Pain Loc --      Pain Edu? --      Excl. in GC? --      Constitutional: Alert and oriented. Well appearing and in no acute distress. Eyes: Conjunctivae are normal. PERRL. EOMI. Head: Atraumatic. ENT:      Ears:       Nose: No congestion/rhinnorhea.      Mouth/Throat: Mucous  membranes are moist.  Neck: No stridor.    Cardiovascular: Normal rate, regular rhythm. Normal S1 and S2.  Good peripheral circulation. Respiratory: Normal respiratory effort without tachypnea or retractions. Lungs CTAB. Good air entry to the bases with no decreased or absent breath sounds. Musculoskeletal: Full range of motion to all extremities. No gross deformities appreciated.  Visualization of the right foot reveals some mild edema compared to left.  No open wounds.  Good range of motion to the digits and ankle joint at this time.  Patient is tender to both the plantar and dorsal aspects of the right foot diffusely along the midfoot.  No point specific tenderness but diffuse tenderness from both the medial to lateral aspect.  Patient is still able to bear weight but does so with a limp.  No tenderness over the ankle joint itself. Neurologic:  Normal speech and language. No gross focal neurologic deficits are appreciated.  Skin:  Skin is warm, dry and intact. No rash noted. Psychiatric: Mood and affect are normal. Speech and behavior are normal. Patient exhibits appropriate insight and judgement.   ____________________________________________   LABS (all labs ordered are listed, but only abnormal results are displayed)  Labs Reviewed - No data to display ____________________________________________  EKG   ____________________________________________  RADIOLOGY I personally viewed and evaluated these images as part of my medical decision making, as well as reviewing the written report by the radiologist.  ED Provider Interpretation: No acute traumatic findings to the foot or ankle  DG Ankle Complete Right  Result Date: 05/05/2021 CLINICAL DATA:  Status post fall.  Pain and swelling. EXAM: RIGHT ANKLE - COMPLETE 3+ VIEW COMPARISON:  None. FINDINGS: Mild diffuse soft tissue swelling. No acute fracture or dislocation. No significant arthropathy. IMPRESSION: 1. Soft tissue swelling. 2.  No acute bone abnormality. Electronically Signed   By: 05/07/2021 M.D.   On: 05/05/2021 19:55   DG Foot Complete Right  Result Date: 05/05/2021 CLINICAL DATA:  Fall. EXAM: RIGHT FOOT COMPLETE - 3+ VIEW COMPARISON:  None. FINDINGS: There is no evidence of fracture or dislocation. There is no evidence of arthropathy or other focal bone abnormality. Soft tissues are unremarkable. IMPRESSION: Negative. Electronically Signed   By: 10-10-1970 M.D.   On: 05/05/2021 19:54    ____________________________________________    PROCEDURES  Procedure(s) performed:    Procedures    Medications  meloxicam (MOBIC) tablet 15 mg (has no administration in time range)     ____________________________________________   INITIAL IMPRESSION / ASSESSMENT AND PLAN / ED COURSE  Pertinent labs & imaging results that were available during my care of the patient were reviewed by me and  considered in my medical decision making (see chart for details).  Review of the [place] CSRS was performed in accordance of the [facility] prior to dispensing any controlled drugs.           Patient's diagnosis is consistent with foot sprain.  Patient presented to the emergency department after missing a step several days ago.  Patient injured her right foot in the process.  Patient has had pain throughout the midfoot region.  No findings on x-ray concerning for fracture.  Patient given postop shoe, anti-inflammatories and instructions to follow-up podiatry if no improvement with conservative measures.  Patient agreeable with this plan.  Return precautions discussed with the patient..  Patient is given ED precautions to return to the ED for any worsening or new symptoms.     ____________________________________________  FINAL CLINICAL IMPRESSION(S) / ED DIAGNOSES  Final diagnoses:  Sprain of right foot, initial encounter      NEW MEDICATIONS STARTED DURING THIS VISIT:  ED Discharge Orders          Ordered     meloxicam (MOBIC) 15 MG tablet  Daily        05/05/21 2047                This chart was dictated using voice recognition software/Dragon. Despite best efforts to proofread, errors can occur which can change the meaning. Any change was purely unintentional.    Racheal Patches, PA-C 05/05/21 2050    Concha Se, MD 05/06/21 1539

## 2021-05-05 NOTE — ED Triage Notes (Signed)
Pt states she tripped going up some stiars on Friday night and since has had inc swelling and pain in right foot. Pt ambulatory to triage with limp.

## 2021-08-08 DIAGNOSIS — Z5321 Procedure and treatment not carried out due to patient leaving prior to being seen by health care provider: Secondary | ICD-10-CM | POA: Insufficient documentation

## 2021-08-08 DIAGNOSIS — R059 Cough, unspecified: Secondary | ICD-10-CM | POA: Insufficient documentation

## 2021-08-08 DIAGNOSIS — R112 Nausea with vomiting, unspecified: Secondary | ICD-10-CM | POA: Insufficient documentation

## 2021-08-08 DIAGNOSIS — R197 Diarrhea, unspecified: Secondary | ICD-10-CM | POA: Insufficient documentation

## 2021-08-08 DIAGNOSIS — Z20822 Contact with and (suspected) exposure to covid-19: Secondary | ICD-10-CM | POA: Insufficient documentation

## 2021-08-08 DIAGNOSIS — R079 Chest pain, unspecified: Secondary | ICD-10-CM | POA: Insufficient documentation

## 2021-08-08 DIAGNOSIS — H9202 Otalgia, left ear: Secondary | ICD-10-CM | POA: Insufficient documentation

## 2021-08-08 DIAGNOSIS — R109 Unspecified abdominal pain: Secondary | ICD-10-CM | POA: Insufficient documentation

## 2021-08-09 ENCOUNTER — Other Ambulatory Visit: Payer: Self-pay

## 2021-08-09 ENCOUNTER — Emergency Department
Admission: EM | Admit: 2021-08-09 | Discharge: 2021-08-09 | Disposition: A | Discharge status: Home / Self Care | Payer: Medicaid Other | Attending: Emergency Medicine | Admitting: Emergency Medicine

## 2021-08-09 LAB — URINALYSIS, ROUTINE W REFLEX MICROSCOPIC
Glucose, UA: NEGATIVE mg/dL
Ketones, ur: 15 mg/dL — AB
Leukocytes,Ua: NEGATIVE
Nitrite: NEGATIVE
Protein, ur: >300 mg/dL — AB
Specific Gravity, Urine: >1.03 — ABNORMAL HIGH (ref 1.005–1.030)
Squamous Epithelial / HPF: >50 — ABNORMAL HIGH (ref 0–5)
pH: 6 (ref 5.0–8.0)

## 2021-08-09 LAB — CBC
HCT: 35.8 % — ABNORMAL LOW (ref 36.0–46.0)
Hemoglobin: 11.5 g/dL — ABNORMAL LOW (ref 12.0–15.0)
MCH: 25.4 pg — ABNORMAL LOW (ref 26.0–34.0)
MCHC: 32.1 g/dL (ref 30.0–36.0)
MCV: 79.2 fL — ABNORMAL LOW (ref 80.0–100.0)
Platelets: 419 10*3/uL — ABNORMAL HIGH (ref 150–400)
RBC: 4.52 MIL/uL (ref 3.87–5.11)
RDW: 15.9 % — ABNORMAL HIGH (ref 11.5–15.5)
WBC: 9.8 10*3/uL (ref 4.0–10.5)
nRBC: 0 % (ref 0.0–0.2)

## 2021-08-09 LAB — RESP PANEL BY RT-PCR (FLU A&B, COVID) ARPGX2
Influenza A by PCR: NEGATIVE
Influenza B by PCR: NEGATIVE
SARS Coronavirus 2 by RT PCR: NEGATIVE

## 2021-08-09 LAB — COMPREHENSIVE METABOLIC PANEL
ALT: 64 U/L — ABNORMAL HIGH (ref 0–44)
AST: 75 U/L — ABNORMAL HIGH (ref 15–41)
Albumin: 3.6 g/dL (ref 3.5–5.0)
Alkaline Phosphatase: 73 U/L (ref 38–126)
Anion gap: 10 (ref 5–15)
BUN: 7 mg/dL (ref 6–20)
CO2: 22 mmol/L (ref 22–32)
Calcium: 8.7 mg/dL — ABNORMAL LOW (ref 8.9–10.3)
Chloride: 107 mmol/L (ref 98–111)
Creatinine, Ser: 0.77 mg/dL (ref 0.44–1.00)
GFR, Estimated: >60 mL/min (ref >60)
Glucose, Bld: 106 mg/dL — ABNORMAL HIGH (ref 70–99)
Potassium: 3.2 mmol/L — ABNORMAL LOW (ref 3.5–5.1)
Sodium: 139 mmol/L (ref 135–145)
Total Bilirubin: 0.5 mg/dL (ref 0.3–1.2)
Total Protein: 7.2 g/dL (ref 6.5–8.1)

## 2021-08-09 LAB — POC URINE PREG, ED: Preg Test, Ur: NEGATIVE

## 2021-08-09 LAB — GROUP A STREP BY PCR: Group A Strep by PCR: NOT DETECTED

## 2021-08-09 LAB — LIPASE, BLOOD: Lipase: 42 U/L (ref 11–51)

## 2021-08-09 NOTE — ED Triage Notes (Signed)
Pt called x's 2, no response 

## 2021-08-09 NOTE — ED Triage Notes (Signed)
Pt called, no response

## 2021-08-09 NOTE — ED Triage Notes (Signed)
Pt complains of abd pain, N/V/D, cough,chest pain, cough, and L ear pain x3 weeks.

## 2021-08-09 NOTE — ED Notes (Signed)
Called x1 @ 9:10am 

## 2021-08-09 NOTE — ED Notes (Signed)
Called X2 @ 9:20am

## 2021-08-09 NOTE — ED Notes (Signed)
Pt called x's 3, no response ?

## 2021-08-09 NOTE — ED Triage Notes (Signed)
Pt called x's 3, no respnse

## 2021-09-04 ENCOUNTER — Emergency Department
Admission: EM | Admit: 2021-09-04 | Discharge: 2021-09-04 | Disposition: A | Discharge status: Home / Self Care | Payer: Self-pay | Attending: Emergency Medicine | Admitting: Emergency Medicine

## 2021-09-04 ENCOUNTER — Emergency Department: Payer: Self-pay

## 2021-09-04 ENCOUNTER — Other Ambulatory Visit: Payer: Self-pay

## 2021-09-04 DIAGNOSIS — M545 Low back pain, unspecified: Secondary | ICD-10-CM | POA: Insufficient documentation

## 2021-09-04 DIAGNOSIS — R109 Unspecified abdominal pain: Secondary | ICD-10-CM

## 2021-09-04 DIAGNOSIS — E119 Type 2 diabetes mellitus without complications: Secondary | ICD-10-CM | POA: Insufficient documentation

## 2021-09-04 DIAGNOSIS — R1011 Right upper quadrant pain: Secondary | ICD-10-CM | POA: Insufficient documentation

## 2021-09-04 DIAGNOSIS — Z794 Long term (current) use of insulin: Secondary | ICD-10-CM | POA: Insufficient documentation

## 2021-09-04 LAB — URINALYSIS, COMPLETE (UACMP) WITH MICROSCOPIC
Glucose, UA: 500 mg/dL — AB
Ketones, ur: 40 mg/dL — AB
Leukocytes,Ua: NEGATIVE
Nitrite: NEGATIVE
Protein, ur: >300 mg/dL — AB
Specific Gravity, Urine: 1.025 (ref 1.005–1.030)
pH: 7 (ref 5.0–8.0)

## 2021-09-04 LAB — BASIC METABOLIC PANEL
Anion gap: 7 (ref 5–15)
BUN: <5 mg/dL — ABNORMAL LOW (ref 6–20)
CO2: 26 mmol/L (ref 22–32)
Calcium: 8.9 mg/dL (ref 8.9–10.3)
Chloride: 105 mmol/L (ref 98–111)
Creatinine, Ser: 0.61 mg/dL (ref 0.44–1.00)
GFR, Estimated: >60 mL/min (ref >60)
Glucose, Bld: 276 mg/dL — ABNORMAL HIGH (ref 70–99)
Potassium: 3.6 mmol/L (ref 3.5–5.1)
Sodium: 138 mmol/L (ref 135–145)

## 2021-09-04 LAB — CBC
HCT: 39.4 % (ref 36.0–46.0)
Hemoglobin: 12.5 g/dL (ref 12.0–15.0)
MCH: 25.7 pg — ABNORMAL LOW (ref 26.0–34.0)
MCHC: 31.7 g/dL (ref 30.0–36.0)
MCV: 81.1 fL (ref 80.0–100.0)
Platelets: 453 10*3/uL — ABNORMAL HIGH (ref 150–400)
RBC: 4.86 MIL/uL (ref 3.87–5.11)
RDW: 16.6 % — ABNORMAL HIGH (ref 11.5–15.5)
WBC: 6.8 10*3/uL (ref 4.0–10.5)
nRBC: 0 % (ref 0.0–0.2)

## 2021-09-04 LAB — HEPATIC FUNCTION PANEL
ALT: 91 U/L — ABNORMAL HIGH (ref 0–44)
AST: 121 U/L — ABNORMAL HIGH (ref 15–41)
Albumin: 3.4 g/dL — ABNORMAL LOW (ref 3.5–5.0)
Alkaline Phosphatase: 81 U/L (ref 38–126)
Bilirubin, Direct: 0.2 mg/dL (ref 0.0–0.2)
Indirect Bilirubin: 0.6 mg/dL (ref 0.3–0.9)
Total Bilirubin: 0.8 mg/dL (ref 0.3–1.2)
Total Protein: 7 g/dL (ref 6.5–8.1)

## 2021-09-04 LAB — POC URINE PREG, ED: Preg Test, Ur: NEGATIVE

## 2021-09-04 LAB — TROPONIN I (HIGH SENSITIVITY)
Troponin I (High Sensitivity): 4 ng/L (ref <18)
Troponin I (High Sensitivity): 5 ng/L (ref <18)

## 2021-09-04 LAB — LIPASE, BLOOD: Lipase: 39 U/L (ref 11–51)

## 2021-09-04 IMAGING — US US ABDOMEN LIMITED
1 series · 14 of 25 positions shown · non-contrast
Comparison: None.

CLINICAL DATA: Abdominal discomfort x1 month

EXAM:
ULTRASOUND ABDOMEN LIMITED RIGHT UPPER QUADRANT

[Series 1: us abdomen limited ruq (liver/gb) · 14 of 44 slices shown]
[im 1/44]
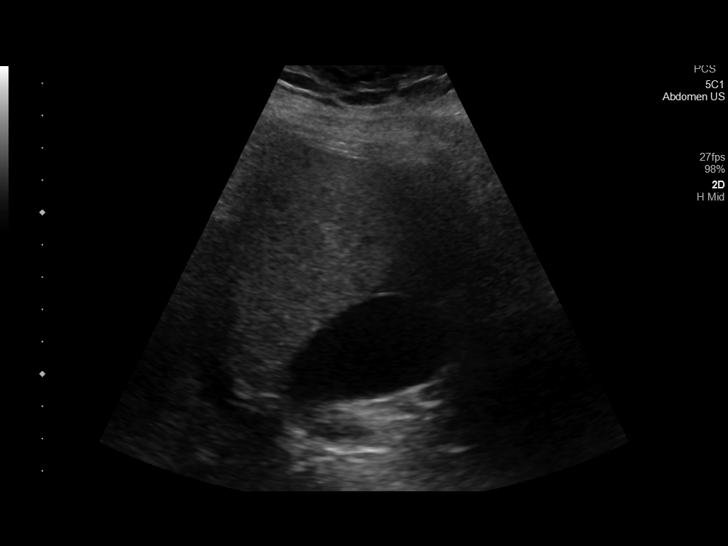
[im 4/44]
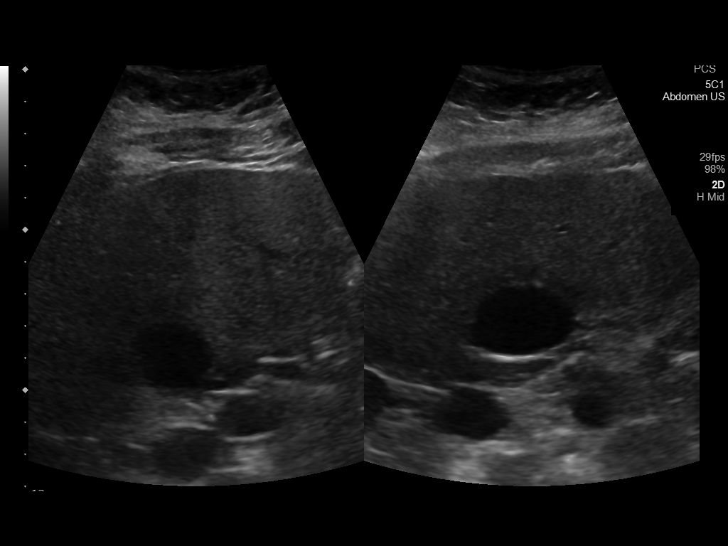
[im 8/44]
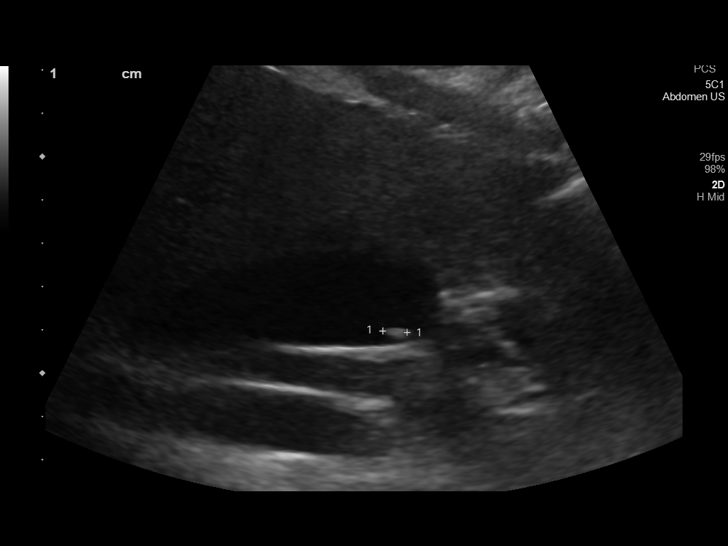
[im 11/44]
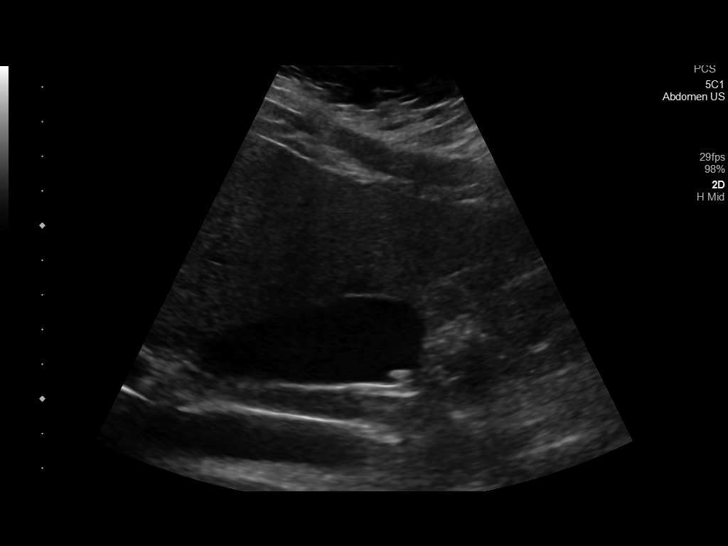
[im 15/44]
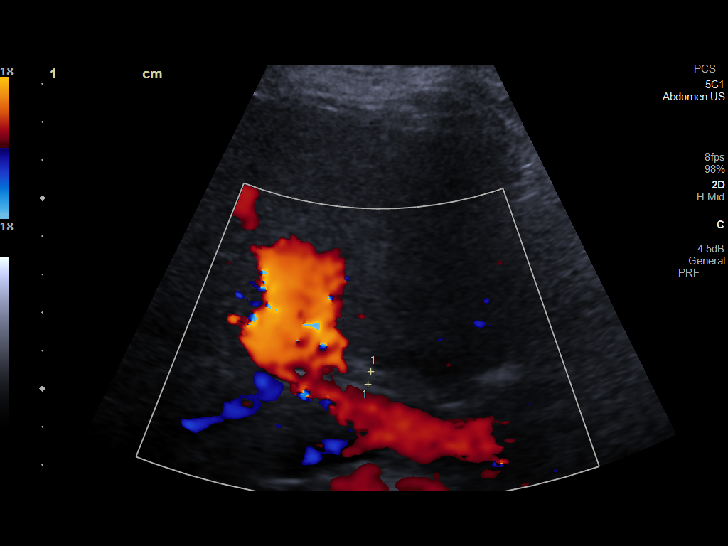
[im 17/44]
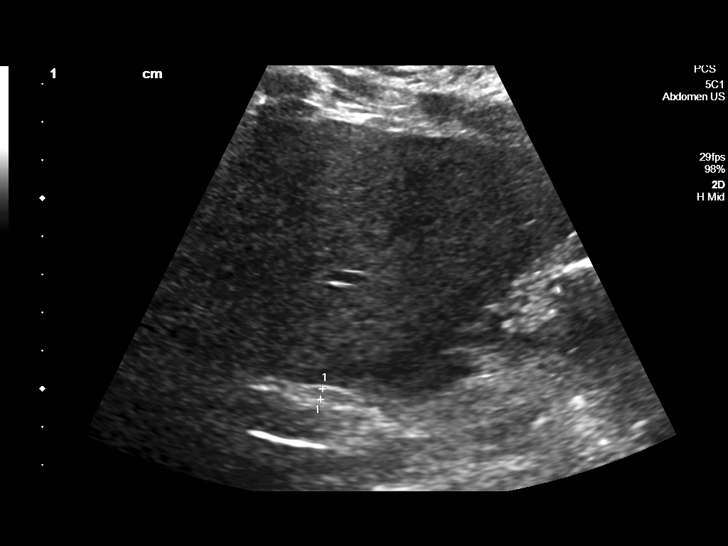
[im 20/44]
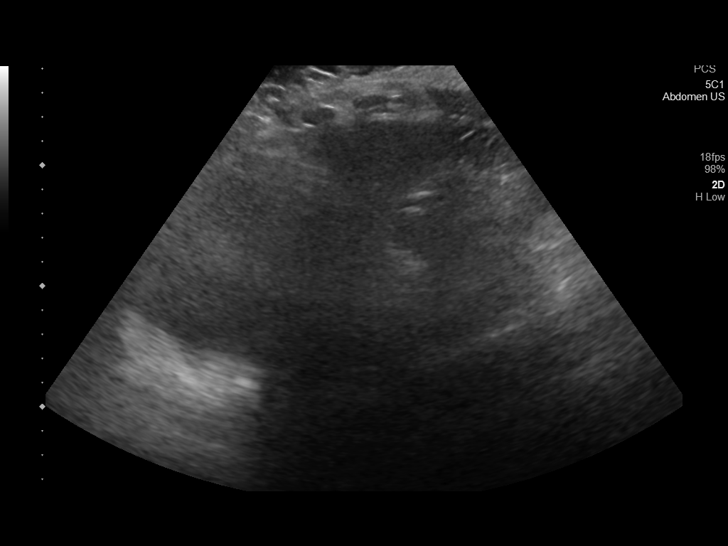
[im 24/44]
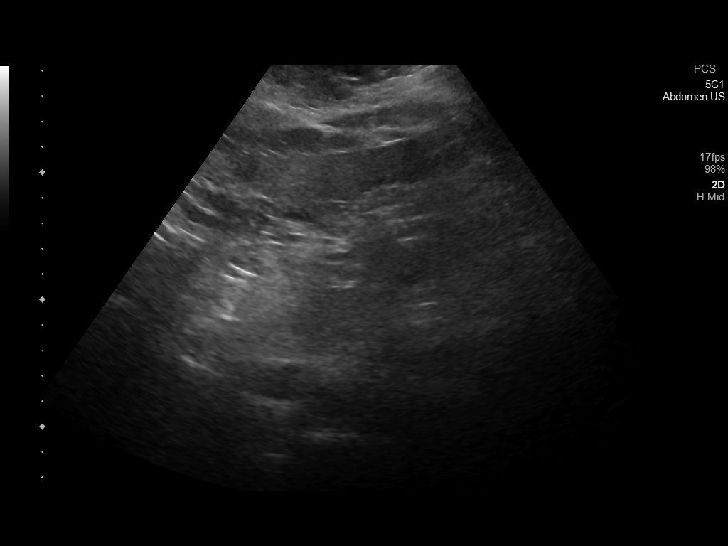
[im 27/44]
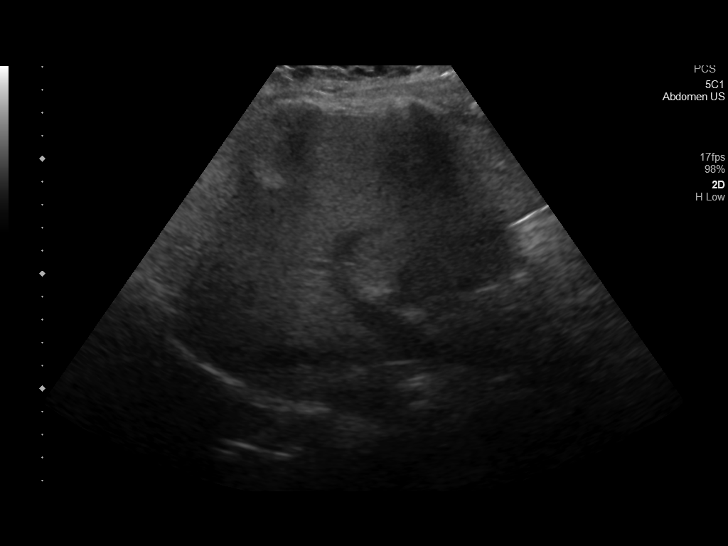
[im 29/44]
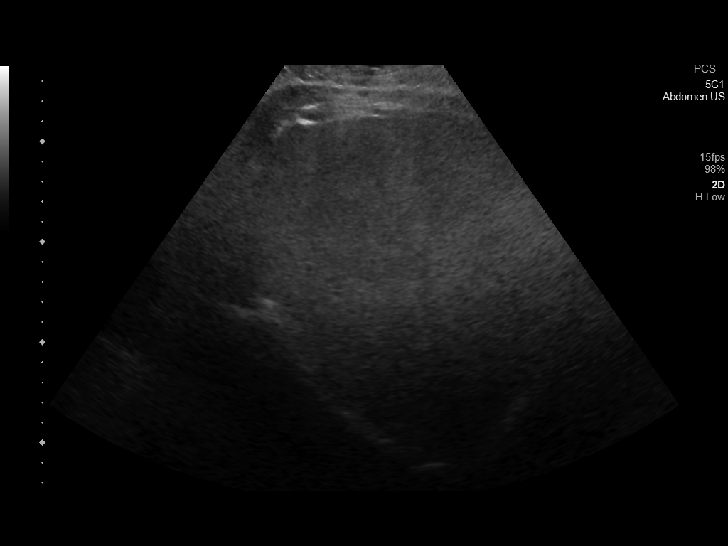
[im 33/44]
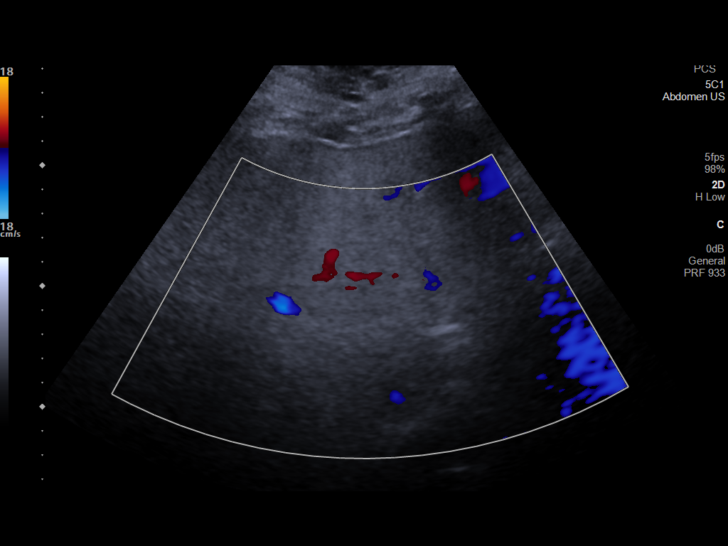
[im 36/44]
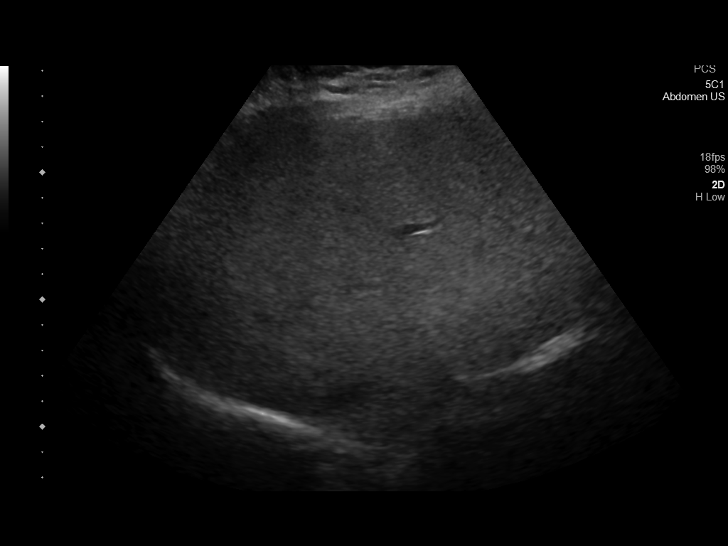
[im 40/44]
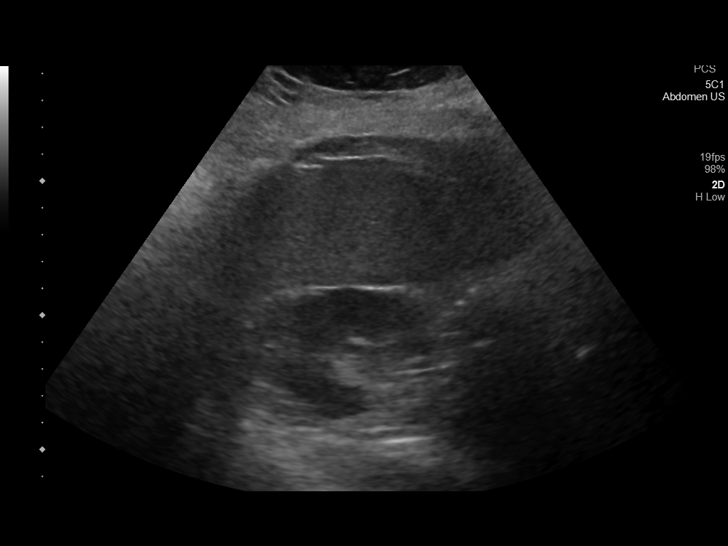
[im 44/44]
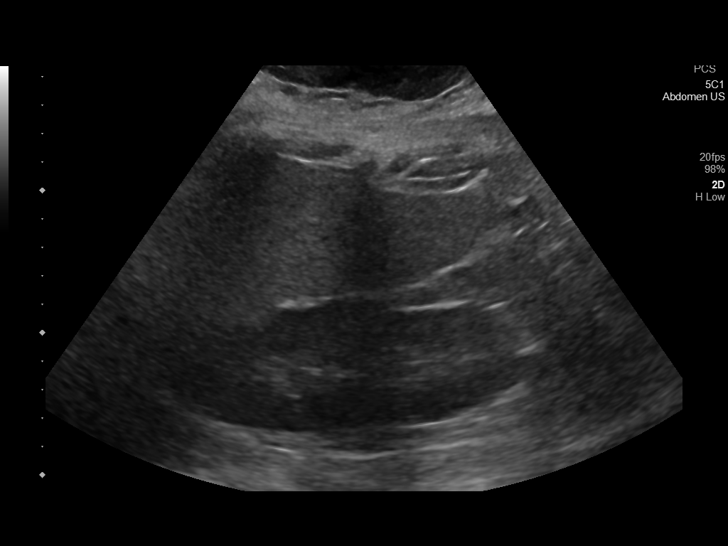

[14 of 25 positions shown; findings below may reference images not displayed]

FINDINGS: Gallbladder:

6 mm gallstone. No gallbladder wall thickening or pericholecystic
fluid. Negative sonographic Murphy's sign.

Common bile duct:

Diameter: 3 mm

Liver:

Hyperechoic hepatic parenchyma, suggesting hepatic steatosis. No
focal hepatic lesion is seen. Portal vein is patent on color Doppler
imaging with normal direction of blood flow towards the liver.

Other: None.
IMPRESSION: Cholelithiasis, without associated sonographic findings to suggest
acute cholecystitis.

Hepatic steatosis.

## 2021-09-04 IMAGING — CR DG CHEST 2V
1 series · 2 of 2 positions shown · non-contrast
Comparison: None.

CLINICAL DATA: Chest pain

EXAM:
CHEST - 2 VIEW

[Series 1: dg chest 2 view · 0.14mm/px · 2 of 2 slices shown]
[im 1/2]
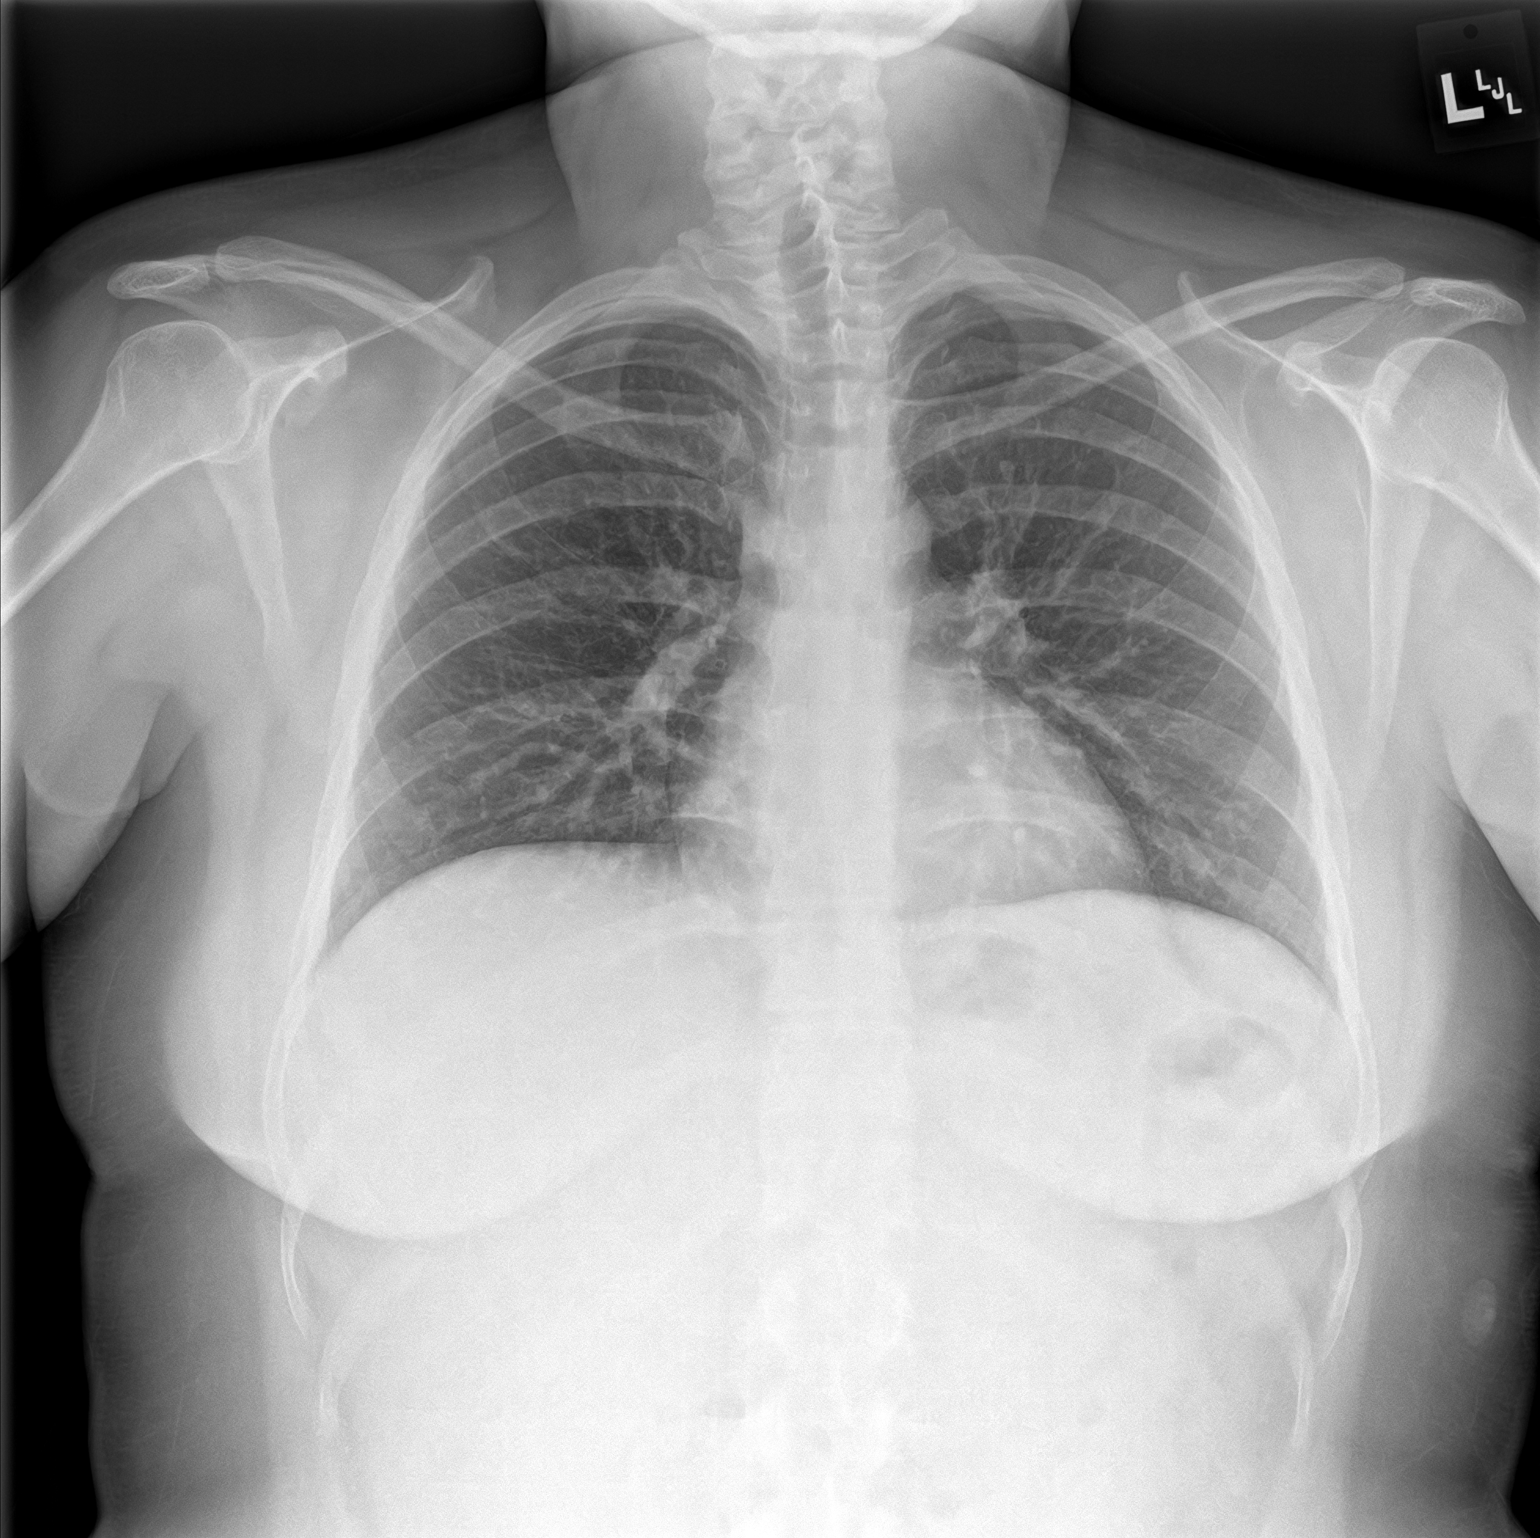
[im 2/2]
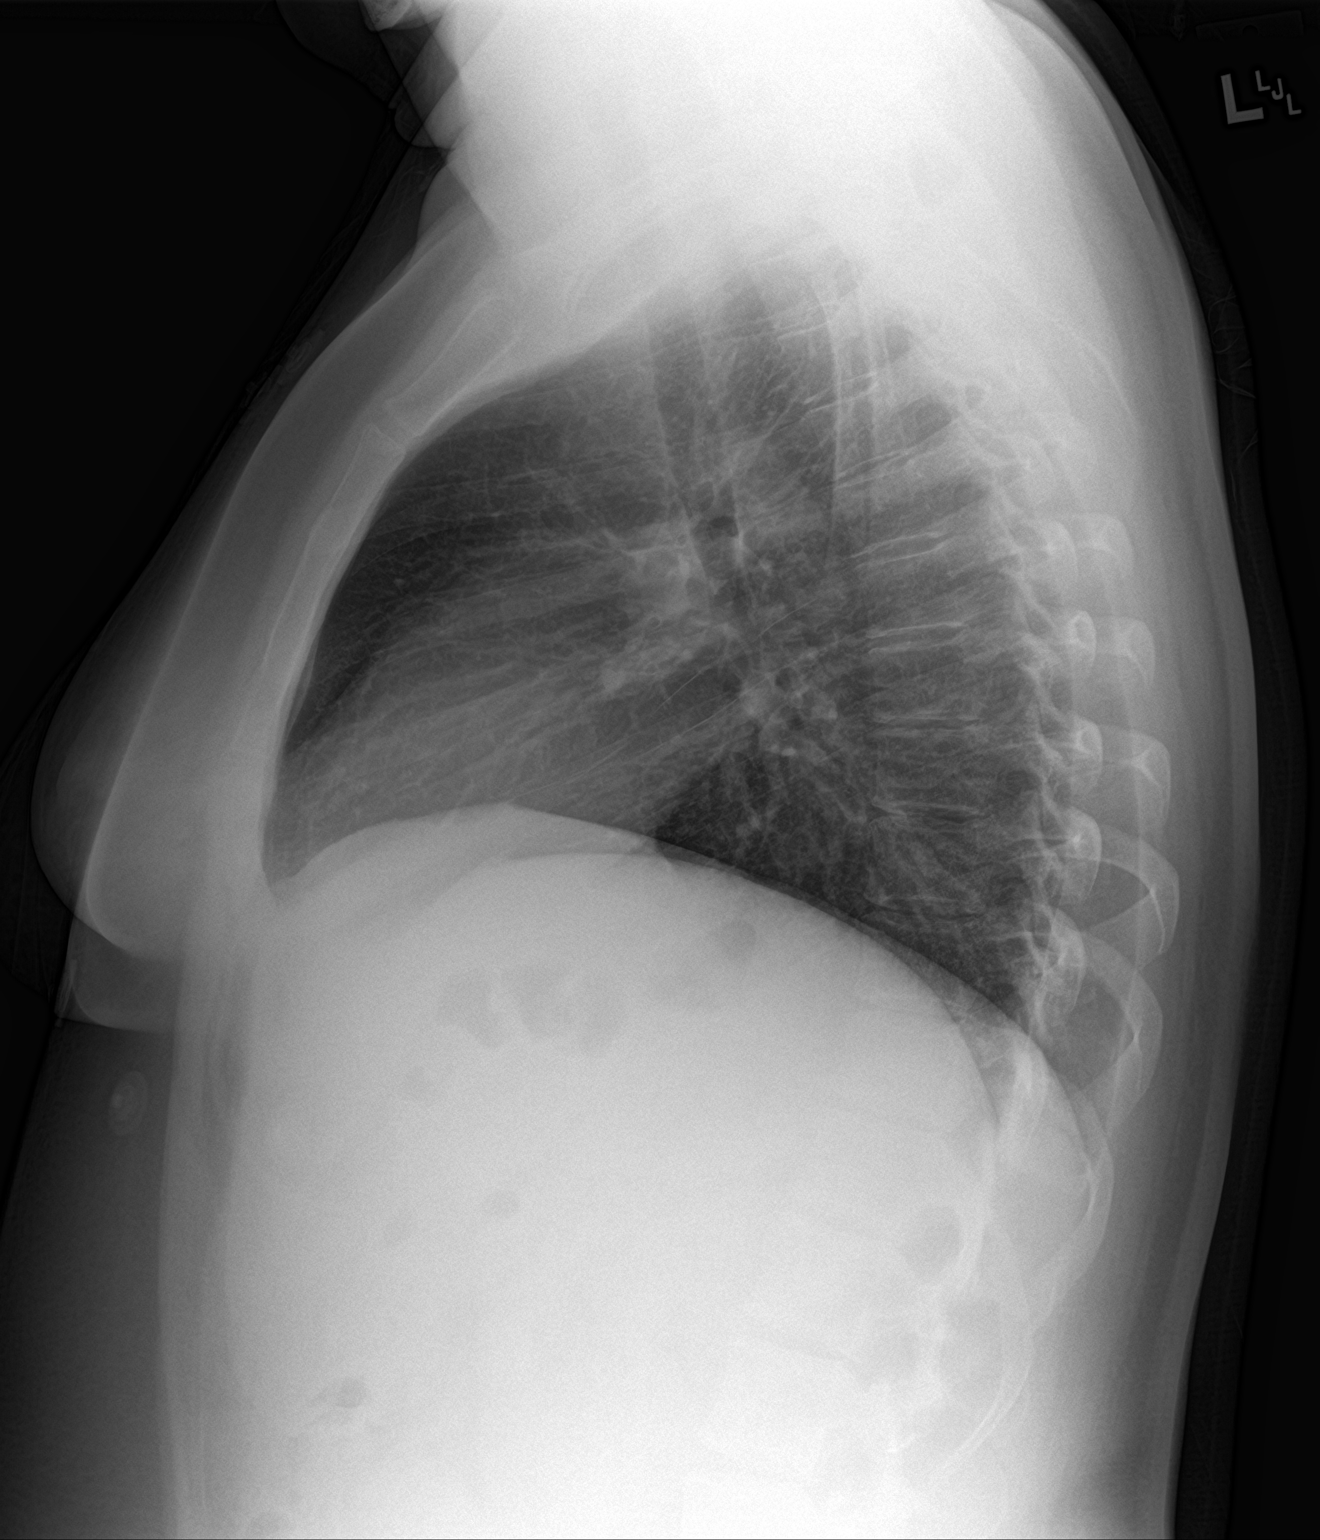

[2 of 2 positions shown; findings below may reference images not displayed]

FINDINGS: The heart size and mediastinal contours are within normal limits.
Both lungs are clear. The visualized skeletal structures are
unremarkable.
IMPRESSION: No active cardiopulmonary disease.

## 2021-09-04 MED ORDER — SODIUM CHLORIDE 0.9 % IV BOLUS
1000.0000 mL | Freq: Once | INTRAVENOUS | Status: AC
  Administered 2021-09-04: 17:00:00 1000 mL via INTRAVENOUS

## 2021-09-04 MED ORDER — FAMOTIDINE 40 MG PO TABS
40.0000 mg | ORAL_TABLET | Freq: Every evening | ORAL | 1 refills | Status: DC
  Filled 2021-09-04: qty 30, 30d supply, fill #0

## 2021-09-04 MED ORDER — FAMOTIDINE 40 MG PO TABS: 40.0000 mg | ORAL_TABLET | Freq: Every evening | ORAL | 1 refills | Status: DC

## 2021-09-04 MED ORDER — FAMOTIDINE IN NACL 20-0.9 MG/50ML-% IV SOLN
20.0000 mg | Freq: Once | INTRAVENOUS | Status: AC
  Administered 2021-09-04: 17:00:00 20 mg via INTRAVENOUS
  Filled 2021-09-04: qty 50

## 2021-09-04 MED ORDER — DICYCLOMINE HCL 10 MG PO CAPS
10.0000 mg | ORAL_CAPSULE | Freq: Once | ORAL | Status: AC
  Administered 2021-09-04: 17:00:00 10 mg via ORAL
  Filled 2021-09-04: qty 1

## 2021-09-04 NOTE — ED Provider Notes (Signed)
[facility]-EMERGENCY DEPARTMENT  ____________________________________________  Time seen: Approximately 5:00 PM  I have reviewed the triage vital signs and the nursing notes.   HISTORY  Chief Complaint Chest Pain and Shortness of Breath   Historian Patient     HPI Vanessa Bush is a 30 y.o. female presents to the emergency department with right upper quadrant abdominal discomfort, low back pain and vomiting.  Patient states that she has vomiting after she eats but states that she sometimes has vomiting spontaneously.  No chest pain, chest tightness or shortness of breath no fever or chills at home.  Patient states that the symptoms have been ongoing for the past 2 months.   Past Medical History:  Diagnosis Date   Diabetes mellitus without complication (HCC)      Immunizations up to date:  Yes.     Past Medical History:  Diagnosis Date   Diabetes mellitus without complication [facility])     Patient Active Problem List   Diagnosis Date Noted   Type II diabetes with long term use of insulin (HCC) 09/18/2020   Hyperlipidemia associated with type 2 diabetes mellitus (HCC) 09/18/2020    History reviewed. No pertinent surgical history.  Prior to Admission medications   Medication Sig Start Date End Date Taking? Authorizing Provider  famotidine (PEPCID) 40 MG tablet Take 1 tablet (40 mg total) by mouth every evening for 7 days. 09/04/21 09/11/21 Yes Pia Mau M, PA-C  BD VEO INSULIN SYRINGE U/F 31G X 15/64" 0.5 ML MISC USE AS DIRECTED WITH INSULIN 09/18/20 09/18/21    COMFORT EZ PEN NEEDLES 32G X 4 MM MISC USE WITH INSULIN 09/18/20 09/18/21    insulin glargine (LANTUS) 100 UNIT/ML injection Inject 0.35 mLs (35 Units total) into the skin daily. 09/18/20   Abate, Desta A, NP  insulin glargine (LANTUS) 100 UNIT/ML Solostar Pen INJECT 35 UNITS INTO THE SKIN EVERY DAY 09/18/20 03/13/21  Abate, Desta A, NP  insulin lispro (HUMALOG) 100 UNIT/ML injection INJECT 12 UNITS INTO THE SKIN 3  TIMES A DAY BEFORE MEALS 09/18/20 09/18/21  Abate, Desta A, NP  lisinopril (ZESTRIL) 2.5 MG tablet Take 1 tablet (2.5 mg total) by mouth daily. 09/18/20   Abate, Desta A, NP  lisinopril (ZESTRIL) 5 MG tablet TAKE 1/2 TABLET BY MOUTH EVERY DAY 09/18/20 09/18/21  Abate, Desta A, NP  meloxicam (MOBIC) 15 MG tablet Take 1 tablet (15 mg total) by mouth daily. 05/05/21   Cuthriell, Delorise Royals, PA-C  metFORMIN (GLUCOPHAGE) 1000 MG tablet TAKE ONE TABLET BY MOUTH 2 TIMES A DAY WITH MEALS 09/18/20 09/18/21  Abate, Desta A, NP    Allergies Patient has no known allergies.  Family History  Problem Relation Age of Onset   Diabetes Mother    Diabetes Father    Diabetes Sister     Social History Social History   Tobacco Use   Smoking status: Never   Smokeless tobacco: Never  Substance Use Topics   Alcohol use: Not Currently   Drug use: Never     Review of Systems  Constitutional: No fever/chills Eyes:  No discharge ENT: No upper respiratory complaints. Respiratory: no cough. No SOB/ use of accessory muscles to breath Gastrointestinal: Patient has abdominal pain.  Musculoskeletal: Negative for musculoskeletal pain. Skin: Negative for rash, abrasions, lacerations, ecchymosis.    ____________________________________________   PHYSICAL EXAM:  VITAL SIGNS: ED Triage Vitals  Enc Vitals Group     BP 09/04/21 1421 126/63     Pulse Rate 09/04/21 1421 76  Resp 09/04/21 1421 16     Temp 09/04/21 1421 98.3 F (36.8 C)     Temp Source 09/04/21 1421 Oral     SpO2 09/04/21 1421 100 %     Weight 09/04/21 1422 192 lb (87.1 kg)     Height 09/04/21 1422 5\' 4"  (1.626 m)     Head Circumference --      Peak Flow --      Pain Score 09/04/21 1422 6     Pain Loc --      Pain Edu? --      Excl. in GC? --      Constitutional: Alert and oriented. Well appearing and in no acute distress. Eyes: Conjunctivae are normal. PERRL. EOMI. Head: Atraumatic. ENT:      Nose: No congestion/rhinnorhea.       Mouth/Throat: Mucous membranes are moist.  Neck: No stridor.  No cervical spine tenderness to palpation.  Cardiovascular: Normal rate, regular rhythm. Normal S1 and S2.  Good peripheral circulation. Respiratory: Normal respiratory effort without tachypnea or retractions. Lungs CTAB. Good air entry to the bases with no decreased or absent breath sounds Gastrointestinal: Bowel sounds x 4 quadrants. Soft and nontender to palpation. No guarding or rigidity. No distention. Musculoskeletal: Full range of motion to all extremities. No obvious deformities noted Neurologic:  Normal for age. No gross focal neurologic deficits are appreciated.  Skin:  Skin is warm, dry and intact. No rash noted. Psychiatric: Mood and affect are normal for age. Speech and behavior are normal.   ____________________________________________   LABS (all labs ordered are listed, but only abnormal results are displayed)  Labs Reviewed  BASIC METABOLIC PANEL - Abnormal; Notable for the following components:      Result Value   Glucose, Bld 276 (*)    BUN <5 (*)    All other components within normal limits  CBC - Abnormal; Notable for the following components:   MCH 25.7 (*)    RDW 16.6 (*)    Platelets 453 (*)    All other components within normal limits  HEPATIC FUNCTION PANEL - Abnormal; Notable for the following components:   Albumin 3.4 (*)    AST 121 (*)    ALT 91 (*)    All other components within normal limits  URINALYSIS, COMPLETE (UACMP) WITH MICROSCOPIC - Abnormal; Notable for the following components:   Glucose, UA 500 (*)    Hgb urine dipstick TRACE (*)    Bilirubin Urine SMALL (*)    Ketones, ur 40 (*)    Protein, ur >300 (*)    Bacteria, UA FEW (*)    All other components within normal limits  LIPASE, BLOOD  HEPATITIS PANEL, ACUTE  POC URINE PREG, ED  TROPONIN I (HIGH SENSITIVITY)  TROPONIN I (HIGH SENSITIVITY)    ____________________________________________  EKG   ____________________________________________  RADIOLOGY 09/06/21, personally viewed and evaluated these images (plain radiographs) as part of my medical decision making, as well as reviewing the written report by the radiologist.  DG Chest 2 View  Result Date: 09/04/2021 CLINICAL DATA:  Chest pain EXAM: CHEST - 2 VIEW COMPARISON:  None. FINDINGS: The heart size and mediastinal contours are within normal limits. Both lungs are clear. The visualized skeletal structures are unremarkable. IMPRESSION: No active cardiopulmonary disease. Electronically Signed   By: 09/06/2021.  Shick M.D.   On: 09/04/2021 14:39   09/06/2021 Abdomen Limited RUQ (LIVER/GB)  Result Date: 09/04/2021 CLINICAL DATA:  Abdominal discomfort x1 month EXAM:  ULTRASOUND ABDOMEN LIMITED RIGHT UPPER QUADRANT COMPARISON:  None. FINDINGS: Gallbladder: 6 mm gallstone. No gallbladder wall thickening or pericholecystic fluid. Negative sonographic Murphy's sign. Common bile duct: Diameter: 3 mm Liver: Hyperechoic hepatic parenchyma, suggesting hepatic steatosis. No focal hepatic lesion is seen. Portal vein is patent on color Doppler imaging with normal direction of blood flow towards the liver. Other: None. IMPRESSION: Cholelithiasis, without associated sonographic findings to suggest acute cholecystitis. Hepatic steatosis. Electronically Signed   By: Charline Bills M.D.   On: 09/04/2021 17:41    ____________________________________________    PROCEDURES  Procedure(s) performed:     Procedures     Medications  sodium chloride 0.9 % bolus 1,000 mL (0 mLs Intravenous Stopped 09/04/21 1824)  famotidine (PEPCID) IVPB 20 mg premix (0 mg Intravenous Stopped 09/04/21 1825)  dicyclomine (BENTYL) capsule 10 mg (10 mg Oral Given 09/04/21 1727)     ____________________________________________   INITIAL IMPRESSION / ASSESSMENT AND PLAN / ED COURSE  Pertinent labs & imaging  results that were available during my care of the patient were reviewed by me and considered in my medical decision making (see chart for details).      Assessment and Plan:  Abdominal Discomfort:  30 year old female presents to the emergency department with right upper quadrant abdominal discomfort, low back pain and some generalized abdominal discomfort that has been going on for over a month.  Vital signs were reassuring at triage.  On physical exam, patient was alert, active and nontoxic-appearing.    Hepatic function panel was concerning for elevated AST and ALT comparison with labs obtained 3 weeks ago.  Normal white blood cell count.  Right upper quadrant ultrasound was obtained which showed hepatic steatosis and cholelithiasis without cholecystitis.  Patient reported that her abdominal discomfort improved after IV Pepcid.  Patient was started on 40 mg of Pepcid once daily and advised to follow-up with GI.   ____________________________________________  FINAL CLINICAL IMPRESSION(S) / ED DIAGNOSES  Final diagnoses:  Abdominal discomfort      NEW MEDICATIONS STARTED DURING THIS VISIT:  ED Discharge Orders          Ordered    famotidine (PEPCID) 40 MG tablet  Every evening        09/04/21 1848                This chart was dictated using voice recognition software/Dragon. Despite best efforts to proofread, errors can occur which can change the meaning. Any change was purely unintentional.     Orvil Feil, PA-C 09/04/21 1853    Phineas Semen, MD 09/04/21 [phone]

## 2021-09-04 NOTE — ED Notes (Signed)
First nurse note  presents with some n/v and abd pain for the past 2 months  has been seen for same  but states PCP sent her here for evaluation

## 2021-09-04 NOTE — Discharge Instructions (Addendum)
Take 40 mg of Pepcid once daily.  

## 2021-09-04 NOTE — ED Triage Notes (Signed)
Pt c/o continued chest, upper and lower abd and lower back pain for the past 2 mo, with intermittent N/V/D. States she saw her PCP yesterday and referred back to the ED for further testing. Pt is in NAD

## 2021-09-05 ENCOUNTER — Other Ambulatory Visit: Payer: Self-pay

## 2021-09-05 LAB — HEPATITIS PANEL, ACUTE
HCV Ab: NONREACTIVE
Hep A IgM: NONREACTIVE
Hep B C IgM: NONREACTIVE
Hepatitis B Surface Ag: NONREACTIVE

## 2021-10-20 ENCOUNTER — Other Ambulatory Visit: Payer: Self-pay

## 2021-10-20 ENCOUNTER — Ambulatory Visit (INDEPENDENT_AMBULATORY_CARE_PROVIDER_SITE_OTHER): Payer: Self-pay | Admitting: Gastroenterology

## 2021-10-20 ENCOUNTER — Encounter: Payer: Self-pay | Admitting: Gastroenterology

## 2021-10-20 VITALS — BP 146/82 | HR 80 | Temp 98.7°F | Ht 62.0 in | Wt 185.0 lb

## 2021-10-20 DIAGNOSIS — R112 Nausea with vomiting, unspecified: Secondary | ICD-10-CM

## 2021-10-20 DIAGNOSIS — R748 Abnormal levels of other serum enzymes: Secondary | ICD-10-CM

## 2021-10-20 MED ORDER — PANTOPRAZOLE SODIUM 40 MG PO TBEC
40.0000 mg | DELAYED_RELEASE_TABLET | Freq: Every day | ORAL | 2 refills | Status: AC
Start: 2021-10-20 — End: ?

## 2021-10-20 NOTE — Progress Notes (Signed)
Gastroenterology Consultation  Referring Provider:     No ref. provider found Primary Care Physician:  System, Provider Not In Primary Gastroenterologist:  Dr. Servando Snare     Reason for Consultation:     Right upper quadrant abdominal pain        HPI:   Vanessa Bush is a 31 y.o. y/o female referred for consultation & management of right upper quadrant abdominal pain by Dr. Cynda Familia, Provider Not In.  This patient comes to see me after being seen in the ER with right upper quadrant abdominal pain that radiates to her low back with symptoms going on for a month prior to being seen back in December.  The patient had ultrasound that showed fatty liver and had abnormal liver enzymes.  The patient liver enzymes showed:  Component     Latest Ref Rng & Units 09/18/2020 08/09/2021 09/04/2021  Albumin     3.5 - 5.0 g/dL 4.0 3.6 3.4 (L)  AST     15 - 41 U/L 25 75 (H) 121 (H)  ALT     0 - 44 U/L 36 (H) 64 (H) 91 (H)  Alkaline Phosphatase     38 - 126 U/L 113 73 81  Total Bilirubin     0.3 - 1.2 mg/dL 0.2 0.5 0.8   At that time the patient had a UA that appears to be consistent with a urinary tract infection. She reports that the symptoms started at the end of October and she has vomiting with a 40 lbs weight loss without trying. She vomits with every meal 45 min to an hour. She reports type II DM.  The patient denies any alcohol abuse.   Past Medical History:  Diagnosis Date   Diabetes mellitus without complication (HCC)     No past surgical history on file.  Prior to Admission medications   Medication Sig Start Date End Date Taking? Authorizing Provider  famotidine (PEPCID) 40 MG tablet Take 1 tablet (40 mg total) by mouth every evening for 7 days. 09/04/21 09/11/21  Orvil Feil, PA-C  insulin glargine (LANTUS) 100 UNIT/ML injection Inject 0.35 mLs (35 Units total) into the skin daily. 09/18/20   Abate, Desta A, NP  insulin glargine (LANTUS) 100 UNIT/ML Solostar Pen INJECT 35 UNITS  INTO THE SKIN EVERY DAY 09/18/20 03/13/21  Abate, Desta A, NP  insulin lispro (HUMALOG) 100 UNIT/ML injection INJECT 12 UNITS INTO THE SKIN 3 TIMES A DAY BEFORE MEALS 09/18/20 09/18/21  Abate, Desta A, NP  lisinopril (ZESTRIL) 2.5 MG tablet Take 1 tablet (2.5 mg total) by mouth daily. 09/18/20   Abate, Desta A, NP  lisinopril (ZESTRIL) 5 MG tablet TAKE 1/2 TABLET BY MOUTH EVERY DAY 09/18/20 09/18/21  Abate, Desta A, NP  meloxicam (MOBIC) 15 MG tablet Take 1 tablet (15 mg total) by mouth daily. 05/05/21   Cuthriell, Delorise Royals, PA-C  metFORMIN (GLUCOPHAGE) 1000 MG tablet TAKE ONE TABLET BY MOUTH 2 TIMES A DAY WITH MEALS 09/18/20 09/18/21  Abate, Desta A, NP    Family History  Problem Relation Age of Onset   Diabetes Mother    Diabetes Father    Diabetes Sister    Colon cancer Maternal Grandfather      Social History   Tobacco Use   Smoking status: Never   Smokeless tobacco: Never  Substance Use Topics   Alcohol use: Not Currently   Drug use: Never    Allergies as of 10/20/2021   (No Known Allergies)  Review of Systems:    All systems reviewed and negative except where noted in HPI.   Physical Exam:  BP (!) 146/82    Pulse 80    Temp 98.7 F (37.1 C) (Oral)    Ht 5\' 2"  (1.575 m)    Wt 185 lb (83.9 kg)    BMI 33.84 kg/m  No LMP recorded. General:   Alert,  Well-developed, well-nourished, pleasant and cooperative in NAD Head:  Normocephalic and atraumatic. Eyes:  Sclera clear, no icterus.   Conjunctiva pink. Ears:  Normal auditory acuity. Neck:  Supple; no masses or thyromegaly. Lungs:  Respirations even and unlabored.  Clear throughout to auscultation.   No wheezes, crackles, or rhonchi. No acute distress. Heart:  Regular rate and rhythm; no murmurs, clicks, rubs, or gallops. Abdomen:  Normal bowel sounds.  No bruits.  Soft, non-tender and non-distended without masses, hepatosplenomegaly or hernias noted.  No guarding or rebound tenderness.  Negative Carnett sign.   Rectal:  Deferred.   Pulses:  Normal pulses noted. Extremities:  No clubbing or edema.  No cyanosis. Neurologic:  Alert and oriented x3;  grossly normal neurologically. Skin:  Intact without significant lesions or rashes.  No jaundice. Lymph Nodes:  No significant cervical adenopathy. Psych:  Alert and cooperative. Normal mood and affect.  Imaging Studies: No results found.  Assessment and Plan:   Vanessa Bush is a 31 y.o. y/o female Who comes in today with a history of abnormal liver enzymes and abdominal discomfort with nausea and vomiting.  The patient was seen by her primary care provider who stated that her back pain and rib pain was likely from her continuous vomiting.  The patient has lost 40 pounds.  The patient has a long standing history of diabetes mellitus.  The patient will be set up for an upper endoscopy to rule out any obstruction.  The patient will also have her liver enzymes repeated.  If the liver enzymes are abnormal she may need a further workup.  The patient will also be set up for a gastric emptying study due to her diabetes and continuous vomiting.  The patient has been explained the plan and agrees with it.    26, MD. Midge Minium    Note: This dictation was prepared with Dragon dictation along with smaller phrase technology. Any transcriptional errors that result from this process are unintentional.

## 2021-10-20 NOTE — Patient Instructions (Signed)
I will call you tomorrow with you Gastric emptying study appointment

## 2021-10-20 NOTE — H&P (View-Only) (Signed)
° ° °Gastroenterology Consultation ° °Referring Provider:     No ref. provider found °Primary Care Physician:  System, Provider Not In °Primary Gastroenterologist:  Dr. Patsey Pitstick     °Reason for Consultation:     Right upper quadrant abdominal pain °      ° HPI:   °Vonnie Torres-Garcia is a 31 y.o. y/o female referred for consultation & management of right upper quadrant abdominal pain by Dr. System, Provider Not In.  This patient comes to see me after being seen in the ER with right upper quadrant abdominal pain that radiates to her low back with symptoms going on for a month prior to being seen back in December.  The patient had ultrasound that showed fatty liver and had abnormal liver enzymes.  The patient liver enzymes showed: ° °Component °    Latest Ref Rng & Units 09/18/2020 08/09/2021 09/04/2021  °Albumin °    3.5 - 5.0 g/dL 4.0 3.6 3.4 (L)  °AST °    15 - 41 U/L 25 75 (H) 121 (H)  °ALT °    0 - 44 U/L 36 (H) 64 (H) 91 (H)  °Alkaline Phosphatase °    38 - 126 U/L 113 73 81  °Total Bilirubin °    0.3 - 1.2 mg/dL 0.2 0.5 0.8  ° °At that time the patient had a UA that appears to be consistent with a urinary tract infection. She reports that the symptoms started at the end of October and she has vomiting with a 40 lbs weight loss without trying. She vomits with every meal 45 min to an hour. She reports type II DM.  The patient denies any alcohol abuse. ° ° °Past Medical History:  °Diagnosis Date  ° Diabetes mellitus without complication (HCC)   ° ° °No past surgical history on file. ° °Prior to Admission medications   °Medication Sig Start Date End Date Taking? Authorizing Provider  °famotidine (PEPCID) 40 MG tablet Take 1 tablet (40 mg total) by mouth every evening for 7 days. 09/04/21 09/11/21  Woods, Jaclyn M, PA-C  °insulin glargine (LANTUS) 100 UNIT/ML injection Inject 0.35 mLs (35 Units total) into the skin daily. 09/18/20   Abate, Desta A, NP  °insulin glargine (LANTUS) 100 UNIT/ML Solostar Pen INJECT 35 UNITS  INTO THE SKIN EVERY DAY 09/18/20 03/13/21  Abate, Desta A, NP  °insulin lispro (HUMALOG) 100 UNIT/ML injection INJECT 12 UNITS INTO THE SKIN 3 TIMES A DAY BEFORE MEALS 09/18/20 09/18/21  Abate, Desta A, NP  °lisinopril (ZESTRIL) 2.5 MG tablet Take 1 tablet (2.5 mg total) by mouth daily. 09/18/20   Abate, Desta A, NP  °lisinopril (ZESTRIL) 5 MG tablet TAKE 1/2 TABLET BY MOUTH EVERY DAY 09/18/20 09/18/21  Abate, Desta A, NP  °meloxicam (MOBIC) 15 MG tablet Take 1 tablet (15 mg total) by mouth daily. 05/05/21   Cuthriell, Jonathan D, PA-C  °metFORMIN (GLUCOPHAGE) 1000 MG tablet TAKE ONE TABLET BY MOUTH 2 TIMES A DAY WITH MEALS 09/18/20 09/18/21  Abate, Desta A, NP  ° ° °Family History  °Problem Relation Age of Onset  ° Diabetes Mother   ° Diabetes Father   ° Diabetes Sister   ° Colon cancer Maternal Grandfather   °  ° °Social History  ° °Tobacco Use  ° Smoking status: Never  ° Smokeless tobacco: Never  °Substance Use Topics  ° Alcohol use: Not Currently  ° Drug use: Never  ° ° °Allergies as of 10/20/2021  ° (No Known Allergies)  ° ° °  Review of Systems:    All systems reviewed and negative except where noted in HPI.   Physical Exam:  BP (!) 146/82    Pulse 80    Temp 98.7 F (37.1 C) (Oral)    Ht 5\' 2"  (1.575 m)    Wt 185 lb (83.9 kg)    BMI 33.84 kg/m  No LMP recorded. General:   Alert,  Well-developed, well-nourished, pleasant and cooperative in NAD Head:  Normocephalic and atraumatic. Eyes:  Sclera clear, no icterus.   Conjunctiva pink. Ears:  Normal auditory acuity. Neck:  Supple; no masses or thyromegaly. Lungs:  Respirations even and unlabored.  Clear throughout to auscultation.   No wheezes, crackles, or rhonchi. No acute distress. Heart:  Regular rate and rhythm; no murmurs, clicks, rubs, or gallops. Abdomen:  Normal bowel sounds.  No bruits.  Soft, non-tender and non-distended without masses, hepatosplenomegaly or hernias noted.  No guarding or rebound tenderness.  Negative Carnett sign.   Rectal:  Deferred.   Pulses:  Normal pulses noted. Extremities:  No clubbing or edema.  No cyanosis. Neurologic:  Alert and oriented x3;  grossly normal neurologically. Skin:  Intact without significant lesions or rashes.  No jaundice. Lymph Nodes:  No significant cervical adenopathy. Psych:  Alert and cooperative. Normal mood and affect.  Imaging Studies: No results found.  Assessment and Plan:   Heran Campau is a 31 y.o. y/o female Who comes in today with a history of abnormal liver enzymes and abdominal discomfort with nausea and vomiting.  The patient was seen by her primary care provider who stated that her back pain and rib pain was likely from her continuous vomiting.  The patient has lost 40 pounds.  The patient has a long standing history of diabetes mellitus.  The patient will be set up for an upper endoscopy to rule out any obstruction.  The patient will also have her liver enzymes repeated.  If the liver enzymes are abnormal she may need a further workup.  The patient will also be set up for a gastric emptying study due to her diabetes and continuous vomiting.  The patient has been explained the plan and agrees with it.    26, MD. Midge Minium    Note: This dictation was prepared with Dragon dictation along with smaller phrase technology. Any transcriptional errors that result from this process are unintentional.

## 2021-10-21 LAB — HEPATIC FUNCTION PANEL
ALT: 43 IU/L — ABNORMAL HIGH (ref 0–32)
AST: 47 IU/L — ABNORMAL HIGH (ref 0–40)
Albumin: 4.2 g/dL (ref 3.9–5.0)
Alkaline Phosphatase: 95 IU/L (ref 44–121)
Bilirubin Total: 0.3 mg/dL (ref 0.0–1.2)
Bilirubin, Direct: 0.14 mg/dL (ref 0.00–0.40)
Total Protein: 6.9 g/dL (ref 6.0–8.5)

## 2021-10-21 NOTE — Addendum Note (Signed)
Addended by: Juron Vorhees Y on: 10/21/2021 04:20 PM ° ° Modules accepted: Orders ° °

## 2021-10-22 ENCOUNTER — Telehealth: Payer: Self-pay

## 2021-10-22 NOTE — Telephone Encounter (Signed)
Patient scheduled for egd  on 11/14/2021

## 2021-10-22 NOTE — Telephone Encounter (Signed)
Left message on voicemail  Gastric emptying scheduled for 10/31/21 [facility] arrive at 8:30am, NPO 8 hours prior and cannot take Pantoprazole

## 2021-10-31 ENCOUNTER — Other Ambulatory Visit: Payer: Self-pay

## 2021-10-31 ENCOUNTER — Encounter
Admission: RE | Admit: 2021-10-31 | Discharge: 2021-10-31 | Disposition: A | Discharge status: Home / Self Care | Payer: Self-pay | Source: Ambulatory Visit | Attending: Gastroenterology | Admitting: Gastroenterology

## 2021-10-31 DIAGNOSIS — R112 Nausea with vomiting, unspecified: Secondary | ICD-10-CM | POA: Insufficient documentation

## 2021-10-31 IMAGING — NM NM GASTRIC EMPTYING
6 series · 20 of 20 positions shown · non-contrast
Comparison: Ultrasound September 04, 2021

CLINICAL DATA: Nausea and vomiting concern for gastroparesis.

EXAM:
NUCLEAR MEDICINE GASTRIC EMPTYING SCAN
TECHNIQUE: After oral ingestion of radiolabeled meal, sequential abdominal
images were obtained for 4 hours. Percentage of activity emptying
the stomach was calculated at 1 hour, 2 hour, 3 hour, and 4 hours.
RADIOPHARMACEUTICALS:  2.86 mCi Yc-EEm sulfur colloid in
standardized meal

[Series 1000: gastric statics (results) · 3.90mm/px · 5 acquisitions, 10 frames shown]
[im 1/5]
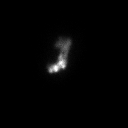
[im 1/5]
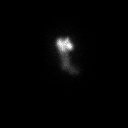
[im 2/5]
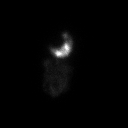
[im 2/5]
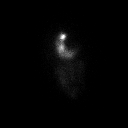
[im 3/5]
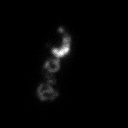
[im 3/5]
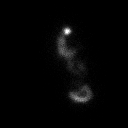
[im 4/5]
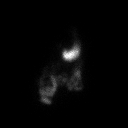
[im 4/5]
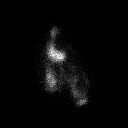
[im 5/5]
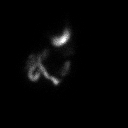
[im 5/5]
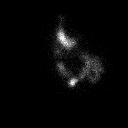

[Series 1000: gastric statics · 3.90mm/px · 2 of 2 frames shown (1 of 5)]
[frame 1/2]
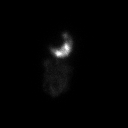
[frame 2/2]
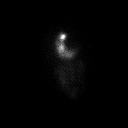

[Series 1000: gastric statics · 3.90mm/px · 2 of 2 frames shown (2 of 5)]
[frame 1/2]
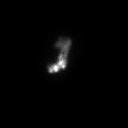
[frame 2/2]
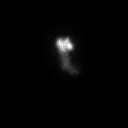

[Series 1000: gastric statics · 3.90mm/px · 2 of 2 frames shown (3 of 5)]
[frame 1/2]
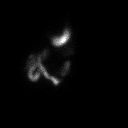
[frame 2/2]
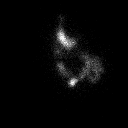

[Series 1000: gastric statics · 3.90mm/px · 2 of 2 frames shown (4 of 5)]
[frame 1/2]
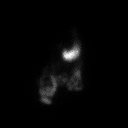
[frame 2/2]
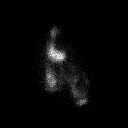

[Series 1000: gastric statics · 3.90mm/px · 2 of 2 frames shown (5 of 5)]
[frame 1/2]
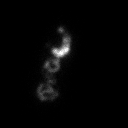
[frame 2/2]
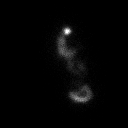

[20 of 20 positions shown; findings below may reference images not displayed]

FINDINGS: Expected location of the stomach in the left upper quadrant.
Ingested meal empties the stomach gradually over the course of the
study.

36% emptied at 1 hr ( normal >= 10%)

55% emptied at 2 hr ( normal >= 40%)

62% emptied at 3 hr ( normal >= 70%)

69% emptied at 4 hr ( normal >= 90%)
IMPRESSION: Scintigraphic findings of delayed gastric emptying.

## 2021-10-31 MED ORDER — TECHNETIUM TC 99M SULFUR COLLOID
2.8600 | Freq: Once | INTRAVENOUS | Status: AC
  Administered 2021-10-31: 2.86 via ORAL

## 2021-10-31 NOTE — Addendum Note (Signed)
Addended by: Roena Malady on: 10/31/2021 10:27 AM   Modules accepted: Orders

## 2021-11-10 ENCOUNTER — Encounter: Payer: Self-pay | Admitting: Gastroenterology

## 2021-11-14 ENCOUNTER — Encounter: Payer: Self-pay | Admitting: Gastroenterology

## 2021-11-14 ENCOUNTER — Other Ambulatory Visit: Payer: Self-pay

## 2021-11-14 ENCOUNTER — Encounter
Admission: RE | Disposition: A | Discharge status: Home / Self Care | Payer: Self-pay | Source: Ambulatory Visit | Attending: Gastroenterology

## 2021-11-14 ENCOUNTER — Ambulatory Visit: Payer: Self-pay | Admitting: Anesthesiology

## 2021-11-14 ENCOUNTER — Ambulatory Visit
Admission: RE | Admit: 2021-11-14 | Discharge: 2021-11-14 | Disposition: A | Discharge status: Home / Self Care | Payer: Self-pay | Source: Ambulatory Visit | Attending: Gastroenterology | Admitting: Gastroenterology

## 2021-11-14 DIAGNOSIS — K760 Fatty (change of) liver, not elsewhere classified: Secondary | ICD-10-CM | POA: Insufficient documentation

## 2021-11-14 DIAGNOSIS — Z794 Long term (current) use of insulin: Secondary | ICD-10-CM | POA: Insufficient documentation

## 2021-11-14 DIAGNOSIS — R748 Abnormal levels of other serum enzymes: Secondary | ICD-10-CM | POA: Insufficient documentation

## 2021-11-14 DIAGNOSIS — Z6833 Body mass index (BMI) 33.0-33.9, adult: Secondary | ICD-10-CM | POA: Insufficient documentation

## 2021-11-14 DIAGNOSIS — R634 Abnormal weight loss: Secondary | ICD-10-CM

## 2021-11-14 DIAGNOSIS — K219 Gastro-esophageal reflux disease without esophagitis: Secondary | ICD-10-CM | POA: Insufficient documentation

## 2021-11-14 DIAGNOSIS — K2950 Unspecified chronic gastritis without bleeding: Secondary | ICD-10-CM | POA: Insufficient documentation

## 2021-11-14 DIAGNOSIS — B9681 Helicobacter pylori [H. pylori] as the cause of diseases classified elsewhere: Secondary | ICD-10-CM | POA: Insufficient documentation

## 2021-11-14 DIAGNOSIS — R112 Nausea with vomiting, unspecified: Secondary | ICD-10-CM

## 2021-11-14 DIAGNOSIS — E119 Type 2 diabetes mellitus without complications: Secondary | ICD-10-CM | POA: Insufficient documentation

## 2021-11-14 HISTORY — DX: Gastro-esophageal reflux disease without esophagitis: K21.9

## 2021-11-14 HISTORY — PX: ESOPHAGOGASTRODUODENOSCOPY (EGD) WITH PROPOFOL: SHX5813

## 2021-11-14 LAB — GLUCOSE, CAPILLARY
Glucose-Capillary: 262 mg/dL — ABNORMAL HIGH (ref 70–99)
Glucose-Capillary: 270 mg/dL — ABNORMAL HIGH (ref 70–99)

## 2021-11-14 SURGERY — ESOPHAGOGASTRODUODENOSCOPY (EGD) WITH PROPOFOL
Anesthesia: General | Site: Mouth

## 2021-11-14 MED ORDER — SODIUM CHLORIDE 0.9 % IV SOLN: INTRAVENOUS | Status: DC

## 2021-11-14 MED ORDER — PROPOFOL 10 MG/ML IV BOLUS
INTRAVENOUS | Status: DC | PRN
Start: 2021-11-14 — End: 2021-11-14
  Administered 2021-11-14: 150 mg via INTRAVENOUS
  Administered 2021-11-14 (×2): 30 mg via INTRAVENOUS
  Administered 2021-11-14 (×2): 40 mg via INTRAVENOUS

## 2021-11-14 MED ORDER — ACETAMINOPHEN 325 MG PO TABS: 325.0000 mg | ORAL_TABLET | ORAL | Status: DC | PRN

## 2021-11-14 MED ORDER — LACTATED RINGERS IV SOLN: INTRAVENOUS | Status: DC

## 2021-11-14 MED ORDER — STERILE WATER FOR IRRIGATION IR SOLN
Status: DC | PRN
  Administered 2021-11-14: 100 mL

## 2021-11-14 MED ORDER — LIDOCAINE HCL (CARDIAC) PF 100 MG/5ML IV SOSY
PREFILLED_SYRINGE | INTRAVENOUS | Status: DC | PRN
  Administered 2021-11-14: 30 mg via INTRAVENOUS

## 2021-11-14 MED ORDER — ONDANSETRON HCL 4 MG/2ML IJ SOLN: 4.0000 mg | Freq: Once | INTRAMUSCULAR | Status: DC | PRN

## 2021-11-14 MED ORDER — ACETAMINOPHEN 160 MG/5ML PO SOLN: 325.0000 mg | ORAL | Status: DC | PRN

## 2021-11-14 SURGICAL SUPPLY — 34 items
BALLN DILATOR 10-12 8 (BALLOONS)
BALLN DILATOR 12-15 8 (BALLOONS)
BALLN DILATOR 15-18 8 (BALLOONS)
BALLN DILATOR CRE 0-12 8 (BALLOONS)
BALLN DILATOR ESOPH 8 10 CRE (MISCELLANEOUS) IMPLANT
BALLOON DILATOR 12-15 8 (BALLOONS) IMPLANT
BALLOON DILATOR 15-18 8 (BALLOONS) IMPLANT
BALLOON DILATOR CRE 0-12 8 (BALLOONS) IMPLANT
BLOCK BITE 60FR ADLT L/F GRN (MISCELLANEOUS) ×2 IMPLANT
CLIP HMST 235XBRD CATH ROT (MISCELLANEOUS) IMPLANT
CLIP RESOLUTION 360 11X235 (MISCELLANEOUS)
ELECT REM PT RETURN 9FT ADLT (ELECTROSURGICAL)
ELECTRODE REM PT RTRN 9FT ADLT (ELECTROSURGICAL) IMPLANT
FCP ESCP3.2XJMB 240X2.8X (MISCELLANEOUS)
FORCEPS BIOP RAD 4 LRG CAP 4 (CUTTING FORCEPS) ×1 IMPLANT
FORCEPS BIOP RJ4 240 W/NDL (MISCELLANEOUS)
FORCEPS ESCP3.2XJMB 240X2.8X (MISCELLANEOUS) IMPLANT
GOWN CVR UNV OPN BCK APRN NK (MISCELLANEOUS) ×2 IMPLANT
GOWN ISOL THUMB LOOP REG UNIV (MISCELLANEOUS) ×4
INJECTOR VARIJECT VIN23 (MISCELLANEOUS) IMPLANT
KIT DEFENDO VALVE AND CONN (KITS) IMPLANT
KIT PRC NS LF DISP ENDO (KITS) ×1 IMPLANT
KIT PROCEDURE OLYMPUS (KITS) ×2
MANIFOLD NEPTUNE II (INSTRUMENTS) ×2 IMPLANT
MARKER SPOT ENDO TATTOO 5ML (MISCELLANEOUS) IMPLANT
RETRIEVER NET PLAT FOOD (MISCELLANEOUS) IMPLANT
SNARE SHORT THROW 13M SML OVAL (MISCELLANEOUS) IMPLANT
SNARE SHORT THROW 30M LRG OVAL (MISCELLANEOUS) IMPLANT
SPOT EX ENDOSCOPIC TATTOO (MISCELLANEOUS)
SYR INFLATION 60ML (SYRINGE) IMPLANT
TRAP ETRAP POLY (MISCELLANEOUS) IMPLANT
VARIJECT INJECTOR VIN23 (MISCELLANEOUS)
WATER STERILE IRR 250ML POUR (IV SOLUTION) ×2 IMPLANT
WIRE CRE 18-20MM 8CM F G (MISCELLANEOUS) IMPLANT

## 2021-11-14 NOTE — Op Note (Signed)
[facility] ?Gastroenterology ?Patient Name: Vanessa Bush ?Procedure Date: 11/14/2021 8:03 AM ?MRN: [id number] ?Account #: [id number] ?Date of Birth: 12/14/1990 ?Admit Type: Outpatient ?Age: 31 ?Room: [facility] OR ROOM 01 ?Gender: Female ?Note Status: Finalized ?Instrument Name: [id number] ?Procedure:             Upper GI endoscopy ?Indications:           Nausea with vomiting, Weight loss ?Providers:             Lucilla Lame MD, MD ?Referring MD:          Rocky Morel, MD (Referring MD) ?Medicines:             Propofol per Anesthesia ?Complications:         No immediate complications. ?Procedure:             Pre-Anesthesia Assessment: ?                       - Prior to the procedure, a History and Physical was  ?                       performed, and patient medications and allergies were  ?                       reviewed. The patient's tolerance of previous  ?                       anesthesia was also reviewed. The risks and benefits  ?                       of the procedure and the sedation options and risks  ?                       were discussed with the patient. All questions were  ?                       answered, and informed consent was obtained. Prior  ?                       Anticoagulants: The patient has taken no previous  ?                       anticoagulant or antiplatelet agents. ASA Grade  ?                       Assessment: II - A patient with mild systemic disease.  ?                       After reviewing the risks and benefits, the patient  ?                       was deemed in satisfactory condition to undergo the  ?                       procedure. ?                       After obtaining informed consent, the endoscope was  ?  passed under direct vision. Throughout the procedure,  ?                       the patient's blood pressure, pulse, and oxygen  ?                       saturations were monitored continuously. The Endoscope  ?                        was introduced through the mouth, and advanced to the  ?                       second part of duodenum. The upper GI endoscopy was  ?                       accomplished without difficulty. The patient tolerated  ?                       the procedure well. ?Findings: ?     The examined esophagus was normal. ?     Diffuse nodular mucosa was found in the gastric antrum. Biopsies were  ?     taken with a cold forceps for histology. ?     The examined duodenum was normal. ?Impression:            - Normal esophagus. ?                       - Nodular mucosa in the gastric antrum. Biopsied. ?                       - Normal examined duodenum. ?Recommendation:        - Discharge patient to home. ?                       - Resume previous diet. ?                       - Continue present medications. ?                       - Await pathology results. ?Procedure Code(s):     --- Professional --- ?                       [id number], Esophagogastroduodenoscopy, flexible,  ?                       transoral; with biopsy, single or multiple ?Diagnosis Code(s):     --- Professional --- ?                       R11.2, Nausea with vomiting, unspecified ?                       R63.4, Abnormal weight loss ?CPT copyright 2019 [employer]. All rights reserved. ?The codes documented in this report are preliminary and upon coder review may  ?be revised to meet current compliance requirements. ?Lucilla Lame MD, MD ?11/14/2021 8:15:44 AM ?This report has been signed electronically. ?Number of Addenda: 0 ?Note Initiated On: 11/14/2021 8:03 AM ?Total Procedure Duration: 0 hours 2 minutes 22 seconds  ?  Estimated Blood Loss:  Estimated blood loss: none. ?     [facility] ?

## 2021-11-14 NOTE — Anesthesia Postprocedure Evaluation (Signed)
Anesthesia Post Note ? ?Patient: Vanessa Bush ? ?Procedure(s) Performed: ESOPHAGOGASTRODUODENOSCOPY (EGD) WITH PROPOFOL (Mouth) ? ? ?  ?Patient location during evaluation: PACU ?Anesthesia Type: General ?Level of consciousness: awake ?Pain management: pain level controlled ?Vital Signs Assessment: post-procedure vital signs reviewed and stable ?Respiratory status: respiratory function stable ?Cardiovascular status: stable ?Postop Assessment: no signs of nausea or vomiting ?Anesthetic complications: no ? ? ?No notable events documented. ? ?Jola Babinski ? ? ? ? ? ?

## 2021-11-14 NOTE — Anesthesia Procedure Notes (Signed)
Date/Time: 11/14/2021 8:07 AM ?Performed by: Maree Krabbe, CRNA ?Pre-anesthesia Checklist: Patient identified, Emergency Drugs available, Suction available, Timeout performed and Patient being monitored ?Patient Re-evaluated:Patient Re-evaluated prior to induction ?Oxygen Delivery Method: Nasal cannula ?Placement Confirmation: positive ETCO2 ? ? ? ? ?

## 2021-11-14 NOTE — Transfer of Care (Signed)
Immediate Anesthesia Transfer of Care Note ? ?Patient: Vanessa Bush ? ?Procedure(s) Performed: ESOPHAGOGASTRODUODENOSCOPY (EGD) WITH PROPOFOL (Mouth) ? ?Patient Location: PACU ? ?Anesthesia Type: General ? ?Level of Consciousness: awake, alert  and patient cooperative ? ?Airway and Oxygen Therapy: Patient Spontanous Breathing and Patient connected to supplemental oxygen ? ?Post-op Assessment: Post-op Vital signs reviewed, Patient's Cardiovascular Status Stable, Respiratory Function Stable, Patent Airway and No signs of Nausea or vomiting ? ?Post-op Vital Signs: Reviewed and stable ? ?Complications: No notable events documented. ? ?

## 2021-11-14 NOTE — Anesthesia Preprocedure Evaluation (Signed)
Anesthesia Evaluation  ?Patient identified by MRN, date of birth, ID band ?Patient awake ? ? ? ?Reviewed: ?Allergy & Precautions, NPO status  ? ?Airway ?Mallampati: II ? ?TM Distance: >3 FB ? ? ? ? Dental ?  ?Pulmonary ? ?  ?Pulmonary exam normal ? ? ? ? ? ? ? Cardiovascular ? ?Rhythm:Regular Rate:Normal ? ?HLD ?  ?Neuro/Psych ?  ? GI/Hepatic ?GERD  ,  ?Endo/Other  ?diabetes, Type 2, Insulin Dependent ? Renal/GU ?  ? ?  ?Musculoskeletal ? ? Abdominal ?  ?Peds ? Hematology ?  ?Anesthesia Other Findings ? ? Reproductive/Obstetrics ? ?  ? ? ? ? ? ? ? ? ? ? ? ? ? ?  ?  ? ? ? ? ? ? ? ? ?Anesthesia Physical ?Anesthesia Plan ? ?ASA: 2 ? ?Anesthesia Plan: General  ? ?Post-op Pain Management:   ? ?Induction: Intravenous ? ?PONV Risk Score and Plan: Propofol infusion, TIVA and Treatment may vary due to age or medical condition ? ?Airway Management Planned: Natural Airway and Nasal Cannula ? ?Additional Equipment:  ? ?Intra-op Plan:  ? ?Post-operative Plan:  ? ?Informed Consent: I have reviewed the patients History and Physical, chart, labs and discussed the procedure including the risks, benefits and alternatives for the proposed anesthesia with the patient or authorized representative who has indicated his/her understanding and acceptance.  ? ? ? ? ? ?Plan Discussed with: CRNA ? ?Anesthesia Plan Comments:   ? ? ? ? ? ? ?Anesthesia Quick Evaluation ? ?

## 2021-11-14 NOTE — Interval H&P Note (Signed)
? ?Midge Minium, MD [facility] ?[phone] ?[place], [place] [address] ?[phone] ?Fax : [phone] ? ?Primary Care Physician:  Randall Hiss, MD ?Primary Gastroenterologist:  Dr. Servando Snare ? ?Pre-Procedure History & Physical: ?HPI:  Vanessa Bush is a 31 y.o. female is here for an endoscopy. ?  ?Past Medical History:  ?Diagnosis Date  ? Diabetes mellitus without complication (HCC)   ? GERD (gastroesophageal reflux disease)   ? ? ?History reviewed. No pertinent surgical history. ? ?Prior to Admission medications   ?Medication Sig Start Date End Date Taking? Authorizing Provider  ?atorvastatin (LIPITOR) 20 MG tablet Take 20 mg by mouth daily.   Yes [provider]  ?insulin glargine (LANTUS) 100 UNIT/ML injection Inject 0.35 mLs (35 Units total) into the skin daily. 09/18/20  Yes Abate, Desta A, NP  ?insulin glargine (LANTUS) 100 UNIT/ML Solostar Pen INJECT 35 UNITS INTO THE SKIN EVERY DAY ?Patient taking differently: Uses 17.5 units twice daily 09/18/20 11/10/21 Yes Abate, Desta A, NP  ?insulin lispro (HUMALOG) 100 UNIT/ML injection INJECT 12 UNITS INTO THE SKIN 3 TIMES A DAY BEFORE MEALS 09/18/20 11/14/21 Yes Abate, Desta A, NP  ?lisinopril (ZESTRIL) 2.5 MG tablet Take 1 tablet (2.5 mg total) by mouth daily. 09/18/20  Yes Abate, Desta A, NP  ?lisinopril (ZESTRIL) 5 MG tablet TAKE 1/2 TABLET BY MOUTH EVERY DAY 09/18/20 11/10/21 Yes Abate, Desta A, NP  ?metFORMIN (GLUCOPHAGE) 1000 MG tablet TAKE ONE TABLET BY MOUTH 2 TIMES A DAY WITH MEALS 09/18/20 11/10/21 Yes Abate, Desta A, NP  ?pantoprazole (PROTONIX) 40 MG tablet Take 1 tablet (40 mg total) by mouth daily. 10/20/21  Yes Midge Minium, MD  ? ? ?Allergies as of 10/20/2021  ? (No Known Allergies)  ? ? ?Family History  ?Problem Relation Age of Onset  ? Diabetes Mother   ? Diabetes Father   ? Diabetes Sister   ? Colon cancer Maternal Grandfather   ? ? ?Social History  ? ?Socioeconomic History  ? Marital status: Married  ?  Spouse name: Not on file  ? Number  of children: Not on file  ? Years of education: Not on file  ? Highest education level: Not on file  ?Occupational History  ? Not on file  ?Tobacco Use  ? Smoking status: Never  ? Smokeless tobacco: Never  ?Vaping Use  ? Vaping Use: Never used  ?Substance and Sexual Activity  ? Alcohol use: Not Currently  ? Drug use: Never  ? Sexual activity: Not on file  ?Other Topics Concern  ? Not on file  ?Social History Narrative  ? Not on file  ? ?Social Determinants of Health  ? ?Financial Resource Strain: Not on file  ?Food Insecurity: Not on file  ?Transportation Needs: Not on file  ?Physical Activity: Not on file  ?Stress: Not on file  ?Social Connections: Not on file  ?Intimate Partner Violence: Not on file  ? ? ?Review of Systems: ?See HPI, otherwise negative ROS ? ?Physical Exam: ?Ht 5\' 2"  (1.575 m)   Wt 83.9 kg   LMP 11/02/2021 (Exact Date) Comment: u preg neg  BMI 33.84 kg/m?  ?General:   Alert,  pleasant and cooperative in NAD ?Head:  Normocephalic and atraumatic. ?Neck:  Supple; no masses or thyromegaly. ?Lungs:  Clear throughout to auscultation.    ?Heart:  Regular rate and rhythm. ?Abdomen:  Soft, nontender and nondistended. Normal bowel sounds, without guarding, and without rebound.   ?Neurologic:  Alert and  oriented x4;  grossly normal neurologically. ? ?  Impression/Plan: ?Vanessa Bush is here for an endoscopy to be performed for nausea and vomiting ? ?Risks, benefits, limitations, and alternatives regarding  endoscopy have been reviewed with the patient.  Questions have been answered.  All parties agreeable. ? ? ?Midge Minium, MD  11/14/2021, 7:20 AM ?

## 2021-11-18 LAB — SURGICAL PATHOLOGY

## 2021-11-20 LAB — POCT PREGNANCY, URINE: Preg Test, Ur: NEGATIVE

## 2021-11-21 ENCOUNTER — Other Ambulatory Visit: Payer: Self-pay | Admitting: Gastroenterology

## 2021-11-21 DIAGNOSIS — Z8619 Personal history of other infectious and parasitic diseases: Secondary | ICD-10-CM

## 2021-11-21 MED ORDER — PANTOPRAZOLE SODIUM 20 MG PO TBEC: 20.0000 mg | DELAYED_RELEASE_TABLET | Freq: Two times a day (BID) | ORAL | 0 refills | Status: DC

## 2021-11-21 MED ORDER — AMOXICILLIN 500 MG PO TABS: 1000.0000 mg | ORAL_TABLET | Freq: Two times a day (BID) | ORAL | 0 refills | Status: DC

## 2021-11-21 MED ORDER — CLARITHROMYCIN 500 MG PO TABS: 500.0000 mg | ORAL_TABLET | Freq: Two times a day (BID) | ORAL | 0 refills | Status: DC

## 2021-11-25 ENCOUNTER — Telehealth: Payer: Self-pay

## 2021-11-25 NOTE — Telephone Encounter (Signed)
Pt returning your call regarding her pathology results and medication prescribed. Pt requesting a call back between 1:30pm - 2:30pm as this is her lunch hour.  ?

## 2022-02-02 ENCOUNTER — Other Ambulatory Visit: Payer: Self-pay | Admitting: Gastroenterology

## 2022-02-02 ENCOUNTER — Encounter: Payer: Self-pay | Admitting: Gastroenterology

## 2022-02-02 ENCOUNTER — Ambulatory Visit (INDEPENDENT_AMBULATORY_CARE_PROVIDER_SITE_OTHER): Payer: Self-pay | Admitting: Gastroenterology

## 2022-02-02 VITALS — BP 127/78 | HR 87 | Temp 98.4°F | Wt 174.0 lb

## 2022-02-02 DIAGNOSIS — R112 Nausea with vomiting, unspecified: Secondary | ICD-10-CM

## 2022-02-02 NOTE — Progress Notes (Signed)
Primary Care Physician: Randall Hiss, MD  Primary Gastroenterologist:  Dr. Midge Minium  Chief Complaint  Patient presents with   Follow-up    Gastric emptying results Repeat H pylori breath test--completed treatment     HPI: Vanessa Bush is a 31 y.o. female here with a history of nausea and vomiting.  The patient had a gastric emptying study done after she last saw me that showed:  36% emptied at 1 hr ( normal >= 10%)   55% emptied at 2 hr ( normal >= 40%)   62% emptied at 3 hr ( normal >= 70%)   69% emptied at 4 hr ( normal >= 90%)   IMPRESSION: Scintigraphic findings of delayed gastric emptying.  When seen by me in February the patient was also having abnormal liver enzymes. The patient had an upper endoscopy in March with some nodularity of the antrum and biopsies of the stomach were positive for H. Pylori. A month later the patient was ordered a H. Pylori breath test to see if the eradication had occurred but it does not appear the patient did this test. The patient was also informed that other tests needed to be done if the liver enzymes were still elevated and last time he had come down somewhat but had not returned to normal. The patient reports that she has had episodes of vomiting but much better since she had the H. pylori eradicated.  The patient states that certain occasions the nausea vomiting has been associated with greasy and fatty foods and large meals.  Past Medical History:  Diagnosis Date   Diabetes mellitus without complication (HCC)    GERD (gastroesophageal reflux disease)     Current Outpatient Medications  Medication Sig Dispense Refill   atorvastatin (LIPITOR) 20 MG tablet Take 20 mg by mouth daily.     insulin glargine (LANTUS) 100 UNIT/ML injection Inject 0.35 mLs (35 Units total) into the skin daily. 10 mL 1   lisinopril (ZESTRIL) 2.5 MG tablet Take 1 tablet (2.5 mg total) by mouth daily. 90 tablet 1   insulin glargine (LANTUS) 100  UNIT/ML Solostar Pen INJECT 35 UNITS INTO THE SKIN EVERY DAY (Patient taking differently: Uses 17.5 units twice daily) 15 mL 1   insulin lispro (HUMALOG) 100 UNIT/ML injection INJECT 12 UNITS INTO THE SKIN 3 TIMES A DAY BEFORE MEALS 10 mL 1   metFORMIN (GLUCOPHAGE) 1000 MG tablet TAKE ONE TABLET BY MOUTH 2 TIMES A DAY WITH MEALS 180 tablet 1   pantoprazole (PROTONIX) 40 MG tablet Take 1 tablet (40 mg total) by mouth daily. (Patient not taking: Reported on 02/02/2022) 30 tablet 2   No current facility-administered medications for this visit.    Allergies as of 02/02/2022   (No Known Allergies)    ROS:  General: Negative for anorexia, weight loss, fever, chills, fatigue, weakness. ENT: Negative for hoarseness, difficulty swallowing , nasal congestion. CV: Negative for chest pain, angina, palpitations, dyspnea on exertion, peripheral edema.  Respiratory: Negative for dyspnea at rest, dyspnea on exertion, cough, sputum, wheezing.  GI: See history of present illness. GU:  Negative for dysuria, hematuria, urinary incontinence, urinary frequency, nocturnal urination.  Endo: Negative for unusual weight change.    Physical Examination:   BP 127/78   Pulse 87   Temp 98.4 F (36.9 C) (Oral)   Wt 174 lb (78.9 kg)   BMI 31.83 kg/m   General: Well-nourished, well-developed in no acute distress.  Eyes: No icterus. Conjunctivae pink. Neuro: Alert and  oriented x 3.  Grossly intact. Psych: Alert and cooperative, normal mood and affect.  Labs:    Imaging Studies: No results found.  Assessment and Plan:   Vanessa Bush is a 31 y.o. y/o female who comes in with a history of nausea and vomiting with gastroparesis and a history of diabetes.  The patient is doing well at the present time and is no longer having any of the nausea vomiting issues.  The patient will be set up for a breath test to document eradication of the H. pylori.  The patient has been explained the plan agrees with the  it.     Lucilla Lame, MD. Marval Regal    Note: This dictation was prepared with Dragon dictation along with smaller phrase technology. Any transcriptional errors that result from this process are unintentional.

## 2022-02-03 LAB — H. PYLORI BREATH TEST: H pylori Breath Test: NEGATIVE
